# Patient Record
Sex: Male | Born: 1935 | Race: White | Hispanic: No | Marital: Married | Smoking: Never smoker
Health system: Southern US, Community
[De-identification: ages and names within clinical notes are randomized; demographics above are authoritative.]

## PROBLEM LIST (undated history)

## (undated) DIAGNOSIS — E785 Hyperlipidemia, unspecified: Secondary | ICD-10-CM

## (undated) DIAGNOSIS — I10 Essential (primary) hypertension: Secondary | ICD-10-CM

## (undated) DIAGNOSIS — E119 Type 2 diabetes mellitus without complications: Secondary | ICD-10-CM

---

## 2015-02-19 ENCOUNTER — Encounter: Payer: Self-pay | Admitting: Family Medicine

## 2015-02-19 ENCOUNTER — Ambulatory Visit (INDEPENDENT_AMBULATORY_CARE_PROVIDER_SITE_OTHER): Payer: Medicare Other | Admitting: Family Medicine

## 2015-02-19 VITALS — BP 113/57 | HR 75 | Wt 199.0 lb

## 2015-02-19 DIAGNOSIS — M7550 Bursitis of unspecified shoulder: Secondary | ICD-10-CM | POA: Insufficient documentation

## 2015-02-19 DIAGNOSIS — M7552 Bursitis of left shoulder: Secondary | ICD-10-CM | POA: Diagnosis not present

## 2015-02-19 DIAGNOSIS — M7541 Impingement syndrome of right shoulder: Secondary | ICD-10-CM | POA: Insufficient documentation

## 2015-02-19 NOTE — Assessment & Plan Note (Signed)
Patient most likely has subacromial bursitis or impingement or mild rotator cuff tendinopathy. He had immediate complete resolution of pain with diagnostic and hopefully therapeutic  guided subacromial injection. Plan for continued home physical therapy type exercises. Recheck in one month.

## 2015-02-19 NOTE — Progress Notes (Signed)
Henry Matthews is a 81 y.o. male who presents to [name]: Primary Care today for second opinion regarding left shoulder pain.  Patient has ongoing left shoulder pain now for several months. He's been evaluated by [facility] primary care sports medicine. X-ray was unremarkable. Unfortunately he is not a candidate for MRI because of shrapnel in his chest. He's had a trial of non-ultrasound-guided subacromial injection and ultrasound-guided glenohumeral injection. He will receive no benefit from either injection. Diagnostic ultrasound per [facility] does show some supraspinatus tendinopathy. Patient experiences pain with overhand motion and reaching back. The pain is located in the anterior lateral upper arm. He denies any significant radiating pain weakness or numbness. Additionally he has pain at night. He has tried some over-the-counter medicines and physical therapy which have helped only minimally.   No past medical history on file. No past surgical history on file. Social History  Substance Use Topics  . Smoking status: Never Smoker   . Smokeless tobacco: Not on file  . Alcohol Use: No   family history is not on file.  Review of Systems: No headache, visual changes, nausea, vomiting, diarrhea, constipation, dizziness, abdominal pain, skin rash, fevers, chills, night sweats, weight loss, swollen lymph nodes, body aches, joint swelling, muscle aches, chest pain, shortness of breath, mood changes, visual or auditory hallucinations.   Medications: Current Outpatient Prescriptions  Medication Sig Dispense Refill  . apixaban (ELIQUIS) 5 MG TABS tablet Take 5 mg by mouth.    Marland Kitchen atorvastatin (LIPITOR) 40 MG tablet Take 40 mg by mouth.    . diltiazem (CARDIZEM CD) 360 MG 24 hr capsule Take 360 mg by mouth.    Marland Kitchen lisinopril (PRINIVIL,ZESTRIL) 20 MG tablet Take 20 mg by mouth.    . metFORMIN  (GLUMETZA) 500 MG (MOD) 24 hr tablet Take 1,000 mg by mouth.     No current facility-administered medications for this visit.   No Known Allergies   Exam:  BP 113/57 mmHg  Pulse 75  Wt 199 lb (90.266 kg) Gen: Well NAD General: Well Developed, well nourished, and in no acute distress.  Neuro/Psych: Alert and oriented x3, extra-ocular muscles intact, able to move all 4 extremities, sensation grossly intact. Skin: Warm and dry, no rashes noted.  Respiratory: Not using accessory muscles, speaking in full sentences, trachea midline.  Cardiovascular: Pulses palpable, no extremity edema. Abdomen: Does not appear distended. Left shoulder relatively normal appearing. Nontender. Range of motion normal external rotation. Internal rotation to the lumbar spine. Abduction full however patient experiences pain with abduction arc beyond 100. Positive Hawkins and Neer's test. Mildly positive empty can test. Negative Yergason's and speeds test. Strength is intact at abduction external and internal rotation. Pulses capillary refill sensation are intact.  Procedure: Real-time Ultrasound Guided Injection of left subacromial bursa  Device: GE Logiq E  Images permanently stored and available for review in the ultrasound unit. Verbal informed consent obtained. Discussed risks and benefits of procedure. Warned about infection bleeding damage to structures skin hypopigmentation and fat atrophy among others. Patient expresses understanding and agreement Time-out conducted.  Noted no overlying erythema, induration, or other signs of local infection.  Skin prepped in a sterile fashion.  Local anesthesia: Topical Ethyl chloride.  With sterile technique and under real time ultrasound guidance: 40 mg of Kenalog and 3 mL of Marcaine injected easily.  Completed without difficulty  Pain immediately resolved suggesting accurate placement of the medication.  Advised to call if fevers/chills,  erythema,  induration, drainage, or persistent bleeding.  Images permanently stored and available for review in the ultrasound unit.  Impression: Technically successful ultrasound guided injection.  Please see individual assessment and plan sections of the noted today.

## 2015-02-19 NOTE — Patient Instructions (Signed)
Thank you for coming in today. Call or go to the ER if you develop a large red swollen joint with extreme pain or oozing puss.   Impingement Syndrome, Rotator Cuff, Bursitis With Rehab Impingement syndrome is a condition that involves inflammation of the tendons of the rotator cuff and the subacromial bursa, that causes pain in the shoulder. The rotator cuff consists of four tendons and muscles that control much of the shoulder and upper arm function. The subacromial bursa is a fluid filled sac that helps reduce friction between the rotator cuff and one of the bones of the shoulder (acromion). Impingement syndrome is usually an overuse injury that causes swelling of the bursa (bursitis), swelling of the tendon (tendonitis), and/or a tear of the tendon (strain). Strains are classified into three categories. Grade 1 strains cause pain, but the tendon is not lengthened. Grade 2 strains include a lengthened ligament, due to the ligament being stretched or partially ruptured. With grade 2 strains there is still function, although the function may be decreased. Grade 3 strains include a complete tear of the tendon or muscle, and function is usually impaired. SYMPTOMS   Pain around the shoulder, often at the outer portion of the upper arm.  Pain that gets worse with shoulder function, especially when reaching overhead or lifting.  Sometimes, aching when not using the arm.  Pain that wakes you up at night.  Sometimes, tenderness, swelling, warmth, or redness over the affected area.  Loss of strength.  Limited motion of the shoulder, especially reaching behind the back (to the back pocket or to unhook bra) or across your body.  Crackling sound (crepitation) when moving the arm.  Biceps tendon pain and inflammation (in the front of the shoulder). Worse when bending the elbow or lifting. CAUSES  Impingement syndrome is often an overuse injury, in which chronic (repetitive) motions cause the tendons or  bursa to become inflamed. A strain occurs when a force is paced on the tendon or muscle that is greater than it can withstand. Common mechanisms of injury include: Stress from sudden increase in duration, frequency, or intensity of training.  Direct hit (trauma) to the shoulder.  Aging, erosion of the tendon with normal use.  Bony bump on shoulder (acromial spur). RISK INCREASES WITH:  Contact sports (football, wrestling, boxing).  Throwing sports (baseball, tennis, volleyball).  Weightlifting and bodybuilding.  Heavy labor.  Previous injury to the rotator cuff, including impingement.  Poor shoulder strength and flexibility.  Failure to warm up properly before activity.  Inadequate protective equipment.  Old age.  Bony bump on shoulder (acromial spur). PREVENTION   Warm up and stretch properly before activity.  Allow for adequate recovery between workouts.  Maintain physical fitness:  Strength, flexibility, and endurance.  Cardiovascular fitness.  Learn and use proper exercise technique. PROGNOSIS  If treated properly, impingement syndrome usually goes away within 6 weeks. Sometimes surgery is required.  RELATED COMPLICATIONS   Longer healing time if not properly treated, or if not given enough time to heal.  Recurring symptoms, that result in a chronic condition.  Shoulder stiffness, frozen shoulder, or loss of motion.  Rotator cuff tendon tear.  Recurring symptoms, especially if activity is resumed too soon, with overuse, with a direct blow, or when using poor technique. TREATMENT  Treatment first involves the use of ice and medicine, to reduce pain and inflammation. The use of strengthening and stretching exercises may help reduce pain with activity. These exercises may be performed at home or   with a therapist. If non-surgical treatment is unsuccessful after more than 6 months, surgery may be advised. After surgery and rehabilitation, activity is usually  possible in 3 months.  MEDICATION  If pain medicine is needed, nonsteroidal anti-inflammatory medicines (aspirin and ibuprofen), or other minor pain relievers (acetaminophen), are often advised.  Do not take pain medicine for 7 days before surgery.  Prescription pain relievers may be given, if your caregiver thinks they are needed. Use only as directed and only as much as you need.  Corticosteroid injections may be given by your caregiver. These injections should be reserved for the most serious cases, because they may only be given a certain number of times. HEAT AND COLD  Cold treatment (icing) should be applied for 10 to 15 minutes every 2 to 3 hours for inflammation and pain, and immediately after activity that aggravates your symptoms. Use ice packs or an ice massage.  Heat treatment may be used before performing stretching and strengthening activities prescribed by your caregiver, physical therapist, or athletic trainer. Use a heat pack or a warm water soak. SEEK MEDICAL CARE IF:   Symptoms get worse or do not improve in 4 to 6 weeks, despite treatment.  New, unexplained symptoms develop. (Drugs used in treatment may produce side effects.) EXERCISES  RANGE OF MOTION (ROM) AND STRETCHING EXERCISES - Impingement Syndrome (Rotator Cuff  Tendinitis, Bursitis) These exercises may help you when beginning to rehabilitate your injury. Your symptoms may go away with or without further involvement from your physician, physical therapist or athletic trainer. While completing these exercises, remember:   Restoring tissue flexibility helps normal motion to return to the joints. This allows healthier, less painful movement and activity.  An effective stretch should be held for at least 30 seconds.  A stretch should never be painful. You should only feel a gentle lengthening or release in the stretched tissue. STRETCH - Flexion, Standing  Stand with good posture. With an underhand grip on your  right / left hand, and an overhand grip on the opposite hand, grasp a broomstick or cane so that your hands are a little more than shoulder width apart.  Keeping your right / left elbow straight and shoulder muscles relaxed, push the stick with your opposite hand, to raise your right / left arm in front of your body and then overhead. Raise your arm until you feel a stretch in your right / left shoulder, but before you have increased shoulder pain.  Try to avoid shrugging your right / left shoulder as your arm rises, by keeping your shoulder blade tucked down and toward your mid-back spine. Hold for __________ seconds.  Slowly return to the starting position. Repeat __________ times. Complete this exercise __________ times per day. STRETCH - Abduction, Supine  Lie on your back. With an underhand grip on your right / left hand and an overhand grip on the opposite hand, grasp a broomstick or cane so that your hands are a little more than shoulder width apart.  Keeping your right / left elbow straight and your shoulder muscles relaxed, push the stick with your opposite hand, to raise your right / left arm out to the side of your body and then overhead. Raise your arm until you feel a stretch in your right / left shoulder, but before you have increased shoulder pain.  Try to avoid shrugging your right / left shoulder as your arm rises, by keeping your shoulder blade tucked down and toward your mid-back spine. Hold   for __________ seconds.  Slowly return to the starting position. Repeat __________ times. Complete this exercise __________ times per day. ROM - Flexion, Active-Assisted  Lie on your back. You may bend your knees for comfort.  Grasp a broomstick or cane so your hands are about shoulder width apart. Your right / left hand should grip the end of the stick, so that your hand is positioned "thumbs-up," as if you were about to shake hands.  Using your healthy arm to lead, raise your right /  left arm overhead, until you feel a gentle stretch in your shoulder. Hold for __________ seconds.  Use the stick to assist in returning your right / left arm to its starting position. Repeat __________ times. Complete this exercise __________ times per day.  ROM - Internal Rotation, Supine   Lie on your back on a firm surface. Place your right / left elbow about 60 degrees away from your side. Elevate your elbow with a folded towel, so that the elbow and shoulder are the same height.  Using a broomstick or cane and your strong arm, pull your right / left hand toward your body until you feel a gentle stretch, but no increase in your shoulder pain. Keep your shoulder and elbow in place throughout the exercise.  Hold for __________ seconds. Slowly return to the starting position. Repeat __________ times. Complete this exercise __________ times per day. STRETCH - Internal Rotation  Place your right / left hand behind your back, palm up.  Throw a towel or belt over your opposite shoulder. Grasp the towel with your right / left hand.  While keeping an upright posture, gently pull up on the towel, until you feel a stretch in the front of your right / left shoulder.  Avoid shrugging your right / left shoulder as your arm rises, by keeping your shoulder blade tucked down and toward your mid-back spine.  Hold for __________ seconds. Release the stretch, by lowering your healthy hand. Repeat __________ times. Complete this exercise __________ times per day. ROM - Internal Rotation   Using an underhand grip, grasp a stick behind your back with both hands.  While standing upright with good posture, slide the stick up your back until you feel a mild stretch in the front of your shoulder.  Hold for __________ seconds. Slowly return to your starting position. Repeat __________ times. Complete this exercise __________ times per day.  STRETCH - Posterior Shoulder Capsule   Stand or sit with good  posture. Grasp your right / left elbow and draw it across your chest, keeping it at the same height as your shoulder.  Pull your elbow, so your upper arm comes in closer to your chest. Pull until you feel a gentle stretch in the back of your shoulder.  Hold for __________ seconds. Repeat __________ times. Complete this exercise __________ times per day. STRENGTHENING EXERCISES - Impingement Syndrome (Rotator Cuff Tendinitis, Bursitis) These exercises may help you when beginning to rehabilitate your injury. They may resolve your symptoms with or without further involvement from your physician, physical therapist or athletic trainer. While completing these exercises, remember:  Muscles can gain both the endurance and the strength needed for everyday activities through controlled exercises.  Complete these exercises as instructed by your physician, physical therapist or athletic trainer. Increase the resistance and repetitions only as guided.  You may experience muscle soreness or fatigue, but the pain or discomfort you are trying to eliminate should never worsen during these exercises. If this   pain does get worse, stop and make sure you are following the directions exactly. If the pain is still present after adjustments, discontinue the exercise until you can discuss the trouble with your clinician.  During your recovery, avoid activity or exercises which involve actions that place your injured hand or elbow above your head or behind your back or head. These positions stress the tissues which you are trying to heal. STRENGTH - Scapular Depression and Adduction   With good posture, sit on a firm chair. Support your arms in front of you, with pillows, arm rests, or on a table top. Have your elbows in line with the sides of your body.  Gently draw your shoulder blades down and toward your mid-back spine. Gradually increase the tension, without tensing the muscles along the top of your shoulders and  the back of your neck.  Hold for __________ seconds. Slowly release the tension and relax your muscles completely before starting the next repetition.  After you have practiced this exercise, remove the arm support and complete the exercise in standing as well as sitting position. Repeat __________ times. Complete this exercise __________ times per day.  STRENGTH - Shoulder Abductors, Isometric  With good posture, stand or sit about 4-6 inches from a wall, with your right / left side facing the wall.  Bend your right / left elbow. Gently press your right / left elbow into the wall. Increase the pressure gradually, until you are pressing as hard as you can, without shrugging your shoulder or increasing any shoulder discomfort.  Hold for __________ seconds.  Release the tension slowly. Relax your shoulder muscles completely before you begin the next repetition. Repeat __________ times. Complete this exercise __________ times per day.  STRENGTH - External Rotators, Isometric  Keep your right / left elbow at your side and bend it 90 degrees.  Step into a door frame so that the outside of your right / left wrist can press against the door frame without your upper arm leaving your side.  Gently press your right / left wrist into the door frame, as if you were trying to swing the back of your hand away from your stomach. Gradually increase the tension, until you are pressing as hard as you can, without shrugging your shoulder or increasing any shoulder discomfort.  Hold for __________ seconds.  Release the tension slowly. Relax your shoulder muscles completely before you begin the next repetition. Repeat __________ times. Complete this exercise __________ times per day.  STRENGTH - Supraspinatus   Stand or sit with good posture. Grasp a __________ weight, or an exercise band or tubing, so that your hand is "thumbs-up," like you are shaking hands.  Slowly lift your right / left arm in a "V"  away from your thigh, diagonally into the space between your side and straight ahead. Lift your hand to shoulder height or as far as you can, without increasing any shoulder pain. At first, many people do not lift their hands above shoulder height.  Avoid shrugging your right / left shoulder as your arm rises, by keeping your shoulder blade tucked down and toward your mid-back spine.  Hold for __________ seconds. Control the descent of your hand, as you slowly return to your starting position. Repeat __________ times. Complete this exercise __________ times per day.  STRENGTH - External Rotators  Secure a rubber exercise band or tubing to a fixed object (table, pole) so that it is at the same height as your right /   left elbow when you are standing or sitting on a firm surface.  Stand or sit so that the secured exercise band is at your uninjured side.  Bend your right / left elbow 90 degrees. Place a folded towel or small pillow under your right / left arm, so that your elbow is a few inches away from your side.  Keeping the tension on the exercise band, pull it away from your body, as if pivoting on your elbow. Be sure to keep your body steady, so that the movement is coming only from your rotating shoulder.  Hold for __________ seconds. Release the tension in a controlled manner, as you return to the starting position. Repeat __________ times. Complete this exercise __________ times per day.  STRENGTH - Internal Rotators   Secure a rubber exercise band or tubing to a fixed object (table, pole) so that it is at the same height as your right / left elbow when you are standing or sitting on a firm surface.  Stand or sit so that the secured exercise band is at your right / left side.  Bend your elbow 90 degrees. Place a folded towel or small pillow under your right / left arm so that your elbow is a few inches away from your side.  Keeping the tension on the exercise band, pull it across your  body, toward your stomach. Be sure to keep your body steady, so that the movement is coming only from your rotating shoulder.  Hold for __________ seconds. Release the tension in a controlled manner, as you return to the starting position. Repeat __________ times. Complete this exercise __________ times per day.  STRENGTH - Scapular Protractors, Standing   Stand arms length away from a wall. Place your hands on the wall, keeping your elbows straight.  Begin by dropping your shoulder blades down and toward your mid-back spine.  To strengthen your protractors, keep your shoulder blades down, but slide them forward on your rib cage. It will feel as if you are lifting the back of your rib cage away from the wall. This is a subtle motion and can be challenging to complete. Ask your caregiver for further instruction, if you are not sure you are doing the exercise correctly.  Hold for __________ seconds. Slowly return to the starting position, resting the muscles completely before starting the next repetition. Repeat __________ times. Complete this exercise __________ times per day. STRENGTH - Scapular Protractors, Supine  Lie on your back on a firm surface. Extend your right / left arm straight into the air while holding a __________ weight in your hand.  Keeping your head and back in place, lift your shoulder off the floor.  Hold for __________ seconds. Slowly return to the starting position, and allow your muscles to relax completely before starting the next repetition. Repeat __________ times. Complete this exercise __________ times per day. STRENGTH - Scapular Protractors, Quadruped  Get onto your hands and knees, with your shoulders directly over your hands (or as close as you can be, comfortably).  Keeping your elbows locked, lift the back of your rib cage up into your shoulder blades, so your mid-back rounds out. Keep your neck muscles relaxed.  Hold this position for __________ seconds.  Slowly return to the starting position and allow your muscles to relax completely before starting the next repetition. Repeat __________ times. Complete this exercise __________ times per day.  STRENGTH - Scapular Retractors  Secure a rubber exercise band or tubing to a   fixed object (table, pole), so that it is at the height of your shoulders when you are either standing, or sitting on a firm armless chair.  With a palm down grip, grasp an end of the band in each hand. Straighten your elbows and lift your hands straight in front of you, at shoulder height. Step back, away from the secured end of the band, until it becomes tense.  Squeezing your shoulder blades together, draw your elbows back toward your sides, as you bend them. Keep your upper arms lifted away from your body throughout the exercise.  Hold for __________ seconds. Slowly ease the tension on the band, as you reverse the directions and return to the starting position. Repeat __________ times. Complete this exercise __________ times per day. STRENGTH - Shoulder Extensors   Secure a rubber exercise band or tubing to a fixed object (table, pole) so that it is at the height of your shoulders when you are either standing, or sitting on a firm armless chair.  With a thumbs-up grip, grasp an end of the band in each hand. Straighten your elbows and lift your hands straight in front of you, at shoulder height. Step back, away from the secured end of the band, until it becomes tense.  Squeezing your shoulder blades together, pull your hands down to the sides of your thighs. Do not allow your hands to go behind you.  Hold for __________ seconds. Slowly ease the tension on the band, as you reverse the directions and return to the starting position. Repeat __________ times. Complete this exercise __________ times per day.  STRENGTH - Scapular Retractors and External Rotators   Secure a rubber exercise band or tubing to a fixed object (table,  pole) so that it is at the height as your shoulders, when you are either standing, or sitting on a firm armless chair.  With a palm down grip, grasp an end of the band in each hand. Bend your elbows 90 degrees and lift your elbows to shoulder height, at your sides. Step back, away from the secured end of the band, until it becomes tense.  Squeezing your shoulder blades together, rotate your shoulders so that your upper arms and elbows remain stationary, but your fists travel upward to head height.  Hold for __________ seconds. Slowly ease the tension on the band, as you reverse the directions and return to the starting position. Repeat __________ times. Complete this exercise __________ times per day.  STRENGTH - Scapular Retractors and External Rotators, Rowing   Secure a rubber exercise band or tubing to a fixed object (table, pole) so that it is at the height of your shoulders, when you are either standing, or sitting on a firm armless chair.  With a palm down grip, grasp an end of the band in each hand. Straighten your elbows and lift your hands straight in front of you, at shoulder height. Step back, away from the secured end of the band, until it becomes tense.  Step 1: Squeeze your shoulder blades together. Bending your elbows, draw your hands to your chest, as if you are rowing a boat. At the end of this motion, your hands and elbow should be at shoulder height and your elbows should be out to your sides.  Step 2: Rotate your shoulders, to raise your hands above your head. Your forearms should be vertical and your upper arms should be horizontal.  Hold for __________ seconds. Slowly ease the tension on the band, as you  reverse the directions and return to the starting position. Repeat __________ times. Complete this exercise __________ times per day.  STRENGTH - Scapular Depressors  Find a sturdy chair without wheels, such as a dining room chair.  Keeping your feet on the floor, and  your hands on the chair arms, lift your bottom up from the seat, and lock your elbows.  Keeping your elbows straight, allow gravity to pull your body weight down. Your shoulders will rise toward your ears.  Raise your body against gravity by drawing your shoulder blades down your back, shortening the distance between your shoulders and ears. Although your feet should always maintain contact with the floor, your feet should progressively support less body weight, as you get stronger.  Hold for __________ seconds. In a controlled and slow manner, lower your body weight to begin the next repetition. Repeat __________ times. Complete this exercise __________ times per day.    This information is not intended to replace advice given to you by your health care provider. Make sure you discuss any questions you have with your health care provider.   Document Released: 01/18/2005 Document Revised: 02/08/2014 Document Reviewed: 05/02/2008 [employer] Interactive Patient Education [employer]

## 2015-02-19 NOTE — Progress Notes (Signed)
Not faxed to  [facility], DO [phone]

## 2015-03-19 ENCOUNTER — Encounter: Payer: Self-pay | Admitting: Family Medicine

## 2015-03-19 ENCOUNTER — Ambulatory Visit (INDEPENDENT_AMBULATORY_CARE_PROVIDER_SITE_OTHER): Payer: Medicare Other | Admitting: Family Medicine

## 2015-03-19 VITALS — BP 122/64 | HR 78 | Wt 197.0 lb

## 2015-03-19 DIAGNOSIS — M7552 Bursitis of left shoulder: Secondary | ICD-10-CM

## 2015-03-19 NOTE — Assessment & Plan Note (Signed)
Much improved following injection. Follow-up with PCP. Return as needed. Continue home exercises.

## 2015-03-19 NOTE — Progress Notes (Signed)
       Henry Matthews is a 81 y.o. male who presents to [name]: Primary Care today for follow-up left shoulder pain. Patient was seen last month for shoulder pain thought to be related to subacromial bursitis. He had a diagnostic and therapeutic subacromial injection and feels almost 100% better now. He continues his home exercise and is pain-free. He is thrilled with his results.   No past medical history on file. No past surgical history on file. Social History  Substance Use Topics  . Smoking status: Never Smoker   . Smokeless tobacco: Not on file  . Alcohol Use: No   family history is not on file.  ROS as above Medications: Current Outpatient Prescriptions  Medication Sig Dispense Refill  . apixaban (ELIQUIS) 5 MG TABS tablet Take 5 mg by mouth.    Marland Kitchen atorvastatin (LIPITOR) 40 MG tablet Take 40 mg by mouth.    . diltiazem (CARDIZEM CD) 360 MG 24 hr capsule Take 360 mg by mouth.    Marland Kitchen lisinopril (PRINIVIL,ZESTRIL) 20 MG tablet Take 20 mg by mouth.    . metFORMIN (GLUMETZA) 500 MG (MOD) 24 hr tablet Take 1,000 mg by mouth.     No current facility-administered medications for this visit.   No Known Allergies   Exam:  BP 122/64 mmHg  Pulse 78  Wt 197 lb (89.359 kg) Gen: Well NAD Left shoulder. Normal-appearing. Normal motion.   No results found for this or any previous visit (from the past 24 hour(s)). No results found.   Please see individual assessment and plan sections.

## 2015-09-30 ENCOUNTER — Ambulatory Visit (INDEPENDENT_AMBULATORY_CARE_PROVIDER_SITE_OTHER): Payer: Medicare Other | Admitting: Family Medicine

## 2015-09-30 VITALS — BP 149/76 | HR 106 | Temp 97.9°F | Resp 18 | Wt 194.1 lb

## 2015-09-30 DIAGNOSIS — M7552 Bursitis of left shoulder: Secondary | ICD-10-CM | POA: Diagnosis not present

## 2015-09-30 NOTE — Progress Notes (Signed)
Pt here for recurrent left shoulder pain.

## 2015-09-30 NOTE — Patient Instructions (Signed)
Thank you for coming in today. Call or go to the ER if you develop a large red swollen joint with extreme pain or oozing puss.  Return as needed.   Continue the home exercises.    Impingement Syndrome, Rotator Cuff, Bursitis With Rehab Impingement syndrome is a condition that involves inflammation of the tendons of the rotator cuff and the subacromial bursa, that causes pain in the shoulder. The rotator cuff consists of four tendons and muscles that control much of the shoulder and upper arm function. The subacromial bursa is a fluid filled sac that helps reduce friction between the rotator cuff and one of the bones of the shoulder (acromion). Impingement syndrome is usually an overuse injury that causes swelling of the bursa (bursitis), swelling of the tendon (tendonitis), and/or a tear of the tendon (strain). Strains are classified into three categories. Grade 1 strains cause pain, but the tendon is not lengthened. Grade 2 strains include a lengthened ligament, due to the ligament being stretched or partially ruptured. With grade 2 strains there is still function, although the function may be decreased. Grade 3 strains include a complete tear of the tendon or muscle, and function is usually impaired. SYMPTOMS   Pain around the shoulder, often at the outer portion of the upper arm.  Pain that gets worse with shoulder function, especially when reaching overhead or lifting.  Sometimes, aching when not using the arm.  Pain that wakes you up at night.  Sometimes, tenderness, swelling, warmth, or redness over the affected area.  Loss of strength.  Limited motion of the shoulder, especially reaching behind the back (to the back pocket or to unhook bra) or across your body.  Crackling sound (crepitation) when moving the arm.  Biceps tendon pain and inflammation (in the front of the shoulder). Worse when bending the elbow or lifting. CAUSES  Impingement syndrome is often an overuse injury, in  which chronic (repetitive) motions cause the tendons or bursa to become inflamed. A strain occurs when a force is paced on the tendon or muscle that is greater than it can withstand. Common mechanisms of injury include: Stress from sudden increase in duration, frequency, or intensity of training.  Direct hit (trauma) to the shoulder.  Aging, erosion of the tendon with normal use.  Bony bump on shoulder (acromial spur). RISK INCREASES WITH:  Contact sports (football, wrestling, boxing).  Throwing sports (baseball, tennis, volleyball).  Weightlifting and bodybuilding.  Heavy labor.  Previous injury to the rotator cuff, including impingement.  Poor shoulder strength and flexibility.  Failure to warm up properly before activity.  Inadequate protective equipment.  Old age.  Bony bump on shoulder (acromial spur). PREVENTION   Warm up and stretch properly before activity.  Allow for adequate recovery between workouts.  Maintain physical fitness:  Strength, flexibility, and endurance.  Cardiovascular fitness.  Learn and use proper exercise technique. PROGNOSIS  If treated properly, impingement syndrome usually goes away within 6 weeks. Sometimes surgery is required.  RELATED COMPLICATIONS   Longer healing time if not properly treated, or if not given enough time to heal.  Recurring symptoms, that result in a chronic condition.  Shoulder stiffness, frozen shoulder, or loss of motion.  Rotator cuff tendon tear.  Recurring symptoms, especially if activity is resumed too soon, with overuse, with a direct blow, or when using poor technique. TREATMENT  Treatment first involves the use of ice and medicine, to reduce pain and inflammation. The use of strengthening and stretching exercises may help reduce  pain with activity. These exercises may be performed at home or with a therapist. If non-surgical treatment is unsuccessful after more than 6 months, surgery may be advised.  After surgery and rehabilitation, activity is usually possible in 3 months.  MEDICATION  If pain medicine is needed, nonsteroidal anti-inflammatory medicines (aspirin and ibuprofen), or other minor pain relievers (acetaminophen), are often advised.  Do not take pain medicine for 7 days before surgery.  Prescription pain relievers may be given, if your caregiver thinks they are needed. Use only as directed and only as much as you need.  Corticosteroid injections may be given by your caregiver. These injections should be reserved for the most serious cases, because they may only be given a certain number of times. HEAT AND COLD  Cold treatment (icing) should be applied for 10 to 15 minutes every 2 to 3 hours for inflammation and pain, and immediately after activity that aggravates your symptoms. Use ice packs or an ice massage.  Heat treatment may be used before performing stretching and strengthening activities prescribed by your caregiver, physical therapist, or athletic trainer. Use a heat pack or a warm water soak. SEEK MEDICAL CARE IF:   Symptoms get worse or do not improve in 4 to 6 weeks, despite treatment.  New, unexplained symptoms develop. (Drugs used in treatment may produce side effects.) EXERCISES  RANGE OF MOTION (ROM) AND STRETCHING EXERCISES - Impingement Syndrome (Rotator Cuff  Tendinitis, Bursitis) These exercises may help you when beginning to rehabilitate your injury. Your symptoms may go away with or without further involvement from your physician, physical therapist or athletic trainer. While completing these exercises, remember:   Restoring tissue flexibility helps normal motion to return to the joints. This allows healthier, less painful movement and activity.  An effective stretch should be held for at least 30 seconds.  A stretch should never be painful. You should only feel a gentle lengthening or release in the stretched tissue. STRETCH - Flexion,  Standing  Stand with good posture. With an underhand grip on your right / left hand, and an overhand grip on the opposite hand, grasp a broomstick or cane so that your hands are a little more than shoulder width apart.  Keeping your right / left elbow straight and shoulder muscles relaxed, push the stick with your opposite hand, to raise your right / left arm in front of your body and then overhead. Raise your arm until you feel a stretch in your right / left shoulder, but before you have increased shoulder pain.  Try to avoid shrugging your right / left shoulder as your arm rises, by keeping your shoulder blade tucked down and toward your mid-back spine. Hold for __________ seconds.  Slowly return to the starting position. Repeat __________ times. Complete this exercise __________ times per day. STRETCH - Abduction, Supine  Lie on your back. With an underhand grip on your right / left hand and an overhand grip on the opposite hand, grasp a broomstick or cane so that your hands are a little more than shoulder width apart.  Keeping your right / left elbow straight and your shoulder muscles relaxed, push the stick with your opposite hand, to raise your right / left arm out to the side of your body and then overhead. Raise your arm until you feel a stretch in your right / left shoulder, but before you have increased shoulder pain.  Try to avoid shrugging your right / left shoulder as your arm rises, by keeping  your shoulder blade tucked down and toward your mid-back spine. Hold for __________ seconds.  Slowly return to the starting position. Repeat __________ times. Complete this exercise __________ times per day. ROM - Flexion, Active-Assisted  Lie on your back. You may bend your knees for comfort.  Grasp a broomstick or cane so your hands are about shoulder width apart. Your right / left hand should grip the end of the stick, so that your hand is positioned "thumbs-up," as if you were about to  shake hands.  Using your healthy arm to lead, raise your right / left arm overhead, until you feel a gentle stretch in your shoulder. Hold for __________ seconds.  Use the stick to assist in returning your right / left arm to its starting position. Repeat __________ times. Complete this exercise __________ times per day.  ROM - Internal Rotation, Supine   Lie on your back on a firm surface. Place your right / left elbow about 60 degrees away from your side. Elevate your elbow with a folded towel, so that the elbow and shoulder are the same height.  Using a broomstick or cane and your strong arm, pull your right / left hand toward your body until you feel a gentle stretch, but no increase in your shoulder pain. Keep your shoulder and elbow in place throughout the exercise.  Hold for __________ seconds. Slowly return to the starting position. Repeat __________ times. Complete this exercise __________ times per day. STRETCH - Internal Rotation  Place your right / left hand behind your back, palm up.  Throw a towel or belt over your opposite shoulder. Grasp the towel with your right / left hand.  While keeping an upright posture, gently pull up on the towel, until you feel a stretch in the front of your right / left shoulder.  Avoid shrugging your right / left shoulder as your arm rises, by keeping your shoulder blade tucked down and toward your mid-back spine.  Hold for __________ seconds. Release the stretch, by lowering your healthy hand. Repeat __________ times. Complete this exercise __________ times per day. ROM - Internal Rotation   Using an underhand grip, grasp a stick behind your back with both hands.  While standing upright with good posture, slide the stick up your back until you feel a mild stretch in the front of your shoulder.  Hold for __________ seconds. Slowly return to your starting position. Repeat __________ times. Complete this exercise __________ times per day.   STRETCH - Posterior Shoulder Capsule   Stand or sit with good posture. Grasp your right / left elbow and draw it across your chest, keeping it at the same height as your shoulder.  Pull your elbow, so your upper arm comes in closer to your chest. Pull until you feel a gentle stretch in the back of your shoulder.  Hold for __________ seconds. Repeat __________ times. Complete this exercise __________ times per day. STRENGTHENING EXERCISES - Impingement Syndrome (Rotator Cuff Tendinitis, Bursitis) These exercises may help you when beginning to rehabilitate your injury. They may resolve your symptoms with or without further involvement from your physician, physical therapist or athletic trainer. While completing these exercises, remember:  Muscles can gain both the endurance and the strength needed for everyday activities through controlled exercises.  Complete these exercises as instructed by your physician, physical therapist or athletic trainer. Increase the resistance and repetitions only as guided.  You may experience muscle soreness or fatigue, but the pain or discomfort you are  trying to eliminate should never worsen during these exercises. If this pain does get worse, stop and make sure you are following the directions exactly. If the pain is still present after adjustments, discontinue the exercise until you can discuss the trouble with your clinician.  During your recovery, avoid activity or exercises which involve actions that place your injured hand or elbow above your head or behind your back or head. These positions stress the tissues which you are trying to heal. STRENGTH - Scapular Depression and Adduction   With good posture, sit on a firm chair. Support your arms in front of you, with pillows, arm rests, or on a table top. Have your elbows in line with the sides of your body.  Gently draw your shoulder blades down and toward your mid-back spine. Gradually increase the tension,  without tensing the muscles along the top of your shoulders and the back of your neck.  Hold for __________ seconds. Slowly release the tension and relax your muscles completely before starting the next repetition.  After you have practiced this exercise, remove the arm support and complete the exercise in standing as well as sitting position. Repeat __________ times. Complete this exercise __________ times per day.  STRENGTH - Shoulder Abductors, Isometric  With good posture, stand or sit about 4-6 inches from a wall, with your right / left side facing the wall.  Bend your right / left elbow. Gently press your right / left elbow into the wall. Increase the pressure gradually, until you are pressing as hard as you can, without shrugging your shoulder or increasing any shoulder discomfort.  Hold for __________ seconds.  Release the tension slowly. Relax your shoulder muscles completely before you begin the next repetition. Repeat __________ times. Complete this exercise __________ times per day.  STRENGTH - External Rotators, Isometric  Keep your right / left elbow at your side and bend it 90 degrees.  Step into a door frame so that the outside of your right / left wrist can press against the door frame without your upper arm leaving your side.  Gently press your right / left wrist into the door frame, as if you were trying to swing the back of your hand away from your stomach. Gradually increase the tension, until you are pressing as hard as you can, without shrugging your shoulder or increasing any shoulder discomfort.  Hold for __________ seconds.  Release the tension slowly. Relax your shoulder muscles completely before you begin the next repetition. Repeat __________ times. Complete this exercise __________ times per day.  STRENGTH - Supraspinatus   Stand or sit with good posture. Grasp a __________ weight, or an exercise band or tubing, so that your hand is "thumbs-up," like you  are shaking hands.  Slowly lift your right / left arm in a "V" away from your thigh, diagonally into the space between your side and straight ahead. Lift your hand to shoulder height or as far as you can, without increasing any shoulder pain. At first, many people do not lift their hands above shoulder height.  Avoid shrugging your right / left shoulder as your arm rises, by keeping your shoulder blade tucked down and toward your mid-back spine.  Hold for __________ seconds. Control the descent of your hand, as you slowly return to your starting position. Repeat __________ times. Complete this exercise __________ times per day.  STRENGTH - External Rotators  Secure a rubber exercise band or tubing to a fixed object (table, pole) so  that it is at the same height as your right / left elbow when you are standing or sitting on a firm surface.  Stand or sit so that the secured exercise band is at your uninjured side.  Bend your right / left elbow 90 degrees. Place a folded towel or small pillow under your right / left arm, so that your elbow is a few inches away from your side.  Keeping the tension on the exercise band, pull it away from your body, as if pivoting on your elbow. Be sure to keep your body steady, so that the movement is coming only from your rotating shoulder.  Hold for __________ seconds. Release the tension in a controlled manner, as you return to the starting position. Repeat __________ times. Complete this exercise __________ times per day.  STRENGTH - Internal Rotators   Secure a rubber exercise band or tubing to a fixed object (table, pole) so that it is at the same height as your right / left elbow when you are standing or sitting on a firm surface.  Stand or sit so that the secured exercise band is at your right / left side.  Bend your elbow 90 degrees. Place a folded towel or small pillow under your right / left arm so that your elbow is a few inches away from your  side.  Keeping the tension on the exercise band, pull it across your body, toward your stomach. Be sure to keep your body steady, so that the movement is coming only from your rotating shoulder.  Hold for __________ seconds. Release the tension in a controlled manner, as you return to the starting position. Repeat __________ times. Complete this exercise __________ times per day.  STRENGTH - Scapular Protractors, Standing   Stand arms length away from a wall. Place your hands on the wall, keeping your elbows straight.  Begin by dropping your shoulder blades down and toward your mid-back spine.  To strengthen your protractors, keep your shoulder blades down, but slide them forward on your rib cage. It will feel as if you are lifting the back of your rib cage away from the wall. This is a subtle motion and can be challenging to complete. Ask your caregiver for further instruction, if you are not sure you are doing the exercise correctly.  Hold for __________ seconds. Slowly return to the starting position, resting the muscles completely before starting the next repetition. Repeat __________ times. Complete this exercise __________ times per day. STRENGTH - Scapular Protractors, Supine  Lie on your back on a firm surface. Extend your right / left arm straight into the air while holding a __________ weight in your hand.  Keeping your head and back in place, lift your shoulder off the floor.  Hold for __________ seconds. Slowly return to the starting position, and allow your muscles to relax completely before starting the next repetition. Repeat __________ times. Complete this exercise __________ times per day. STRENGTH - Scapular Protractors, Quadruped  Get onto your hands and knees, with your shoulders directly over your hands (or as close as you can be, comfortably).  Keeping your elbows locked, lift the back of your rib cage up into your shoulder blades, so your mid-back rounds out. Keep  your neck muscles relaxed.  Hold this position for __________ seconds. Slowly return to the starting position and allow your muscles to relax completely before starting the next repetition. Repeat __________ times. Complete this exercise __________ times per day.  STRENGTH - Scapular  Retractors  Secure a rubber exercise band or tubing to a fixed object (table, pole), so that it is at the height of your shoulders when you are either standing, or sitting on a firm armless chair.  With a palm down grip, grasp an end of the band in each hand. Straighten your elbows and lift your hands straight in front of you, at shoulder height. Step back, away from the secured end of the band, until it becomes tense.  Squeezing your shoulder blades together, draw your elbows back toward your sides, as you bend them. Keep your upper arms lifted away from your body throughout the exercise.  Hold for __________ seconds. Slowly ease the tension on the band, as you reverse the directions and return to the starting position. Repeat __________ times. Complete this exercise __________ times per day. STRENGTH - Shoulder Extensors   Secure a rubber exercise band or tubing to a fixed object (table, pole) so that it is at the height of your shoulders when you are either standing, or sitting on a firm armless chair.  With a thumbs-up grip, grasp an end of the band in each hand. Straighten your elbows and lift your hands straight in front of you, at shoulder height. Step back, away from the secured end of the band, until it becomes tense.  Squeezing your shoulder blades together, pull your hands down to the sides of your thighs. Do not allow your hands to go behind you.  Hold for __________ seconds. Slowly ease the tension on the band, as you reverse the directions and return to the starting position. Repeat __________ times. Complete this exercise __________ times per day.  STRENGTH - Scapular Retractors and External  Rotators   Secure a rubber exercise band or tubing to a fixed object (table, pole) so that it is at the height as your shoulders, when you are either standing, or sitting on a firm armless chair.  With a palm down grip, grasp an end of the band in each hand. Bend your elbows 90 degrees and lift your elbows to shoulder height, at your sides. Step back, away from the secured end of the band, until it becomes tense.  Squeezing your shoulder blades together, rotate your shoulders so that your upper arms and elbows remain stationary, but your fists travel upward to head height.  Hold for __________ seconds. Slowly ease the tension on the band, as you reverse the directions and return to the starting position. Repeat __________ times. Complete this exercise __________ times per day.  STRENGTH - Scapular Retractors and External Rotators, Rowing   Secure a rubber exercise band or tubing to a fixed object (table, pole) so that it is at the height of your shoulders, when you are either standing, or sitting on a firm armless chair.  With a palm down grip, grasp an end of the band in each hand. Straighten your elbows and lift your hands straight in front of you, at shoulder height. Step back, away from the secured end of the band, until it becomes tense.  Step 1: Squeeze your shoulder blades together. Bending your elbows, draw your hands to your chest, as if you are rowing a boat. At the end of this motion, your hands and elbow should be at shoulder height and your elbows should be out to your sides.  Step 2: Rotate your shoulders, to raise your hands above your head. Your forearms should be vertical and your upper arms should be horizontal.  Hold for  __________ seconds. Slowly ease the tension on the band, as you reverse the directions and return to the starting position. Repeat __________ times. Complete this exercise __________ times per day.  STRENGTH - Scapular Depressors  Find a sturdy chair  without wheels, such as a dining room chair.  Keeping your feet on the floor, and your hands on the chair arms, lift your bottom up from the seat, and lock your elbows.  Keeping your elbows straight, allow gravity to pull your body weight down. Your shoulders will rise toward your ears.  Raise your body against gravity by drawing your shoulder blades down your back, shortening the distance between your shoulders and ears. Although your feet should always maintain contact with the floor, your feet should progressively support less body weight, as you get stronger.  Hold for __________ seconds. In a controlled and slow manner, lower your body weight to begin the next repetition. Repeat __________ times. Complete this exercise __________ times per day.    This information is not intended to replace advice given to you by your health care provider. Make sure you discuss any questions you have with your health care provider.   Document Released: 01/18/2005 Document Revised: 02/08/2014 Document Reviewed: 05/02/2008 [employer] Interactive Patient Education [employer]

## 2015-09-30 NOTE — Progress Notes (Addendum)
Henry Matthews is a 81 y.o. male who presents to [name]: Primary [facility] Sports Medicine today for left shoulder pain. Patient in the past has been diagnosed with and treated for subacromial bursitis/rotator cuff tendinosis several times. Most recently he received a subacromial injection on 02/19/2015 that worked until 2 days ago. He has been doing home exercises as well as home. 2 days ago he notes onset of pain in the anterior to lateral upper arm worsens overhead motion reaching back. He denies any radiating pain weakness or numbness or loss of function. Symptoms are moderate and do interfere with normal activities.   No past medical history on file. No past surgical history on file. Social History  Substance Use Topics  . Smoking status: Never Smoker  . Smokeless tobacco: Not on file  . Alcohol use No   family history is not on file.  ROS as above:  Medications: Current Outpatient Prescriptions  Medication Sig Dispense Refill  . apixaban (ELIQUIS) 5 MG TABS tablet Take 5 mg by mouth.    Marland Kitchen atorvastatin (LIPITOR) 40 MG tablet Take 40 mg by mouth.    . diltiazem (CARDIZEM CD) 360 MG 24 hr capsule Take 360 mg by mouth.    Marland Kitchen lisinopril (PRINIVIL,ZESTRIL) 20 MG tablet Take 20 mg by mouth.    . metFORMIN (GLUMETZA) 500 MG (MOD) 24 hr tablet Take 1,000 mg by mouth.     No current facility-administered medications for this visit.    No Known Allergies   Exam:  BP (!) 149/76 (BP Location: Right Arm, Patient Position: Sitting, Cuff Size: Normal)   Pulse (!) 106   Temp 97.9 F (36.6 C) (Oral)   Resp 18   Wt 194 lb 1.3 oz (88 kg)   SpO2 99%  Gen: Well NAD Left shoulder: Normal-appearing nontender. Normal motion of her pain with abduction arc. Mild positive impingement testing.  Procedure: Real-time Ultrasound Guided Injection of left subacromial bursa  Device: GE Logiq E  Images  permanently stored and available for review in the ultrasound unit. Verbal informed consent obtained. Discussed risks and benefits of procedure. Warned about infection bleeding damage to structures skin hypopigmentation and fat atrophy among others. Patient expresses understanding and agreement Time-out conducted.  Noted no overlying erythema, induration, or other signs of local infection.  Skin prepped in a sterile fashion.  Local anesthesia: Topical Ethyl chloride.  With sterile technique and under real time ultrasound guidance: 40 mg of Kenalog and 3 mL of Marcaine injected easily.  Completed without difficulty  Pain immediately resolved suggesting accurate placement of the medication.  Advised to call if fevers/chills, erythema, induration, drainage, or persistent bleeding.  Images permanently stored and available for review in the ultrasound unit.  Impression: Technically successful ultrasound guided injection.  Lot number: Marcaine [id number] Kenalog [id number]  No results found for this or any previous visit (from the past 24 hour(s)). No results found.    Assessment and Plan: 81 y.o. male with left shoulder impingement/bursitis. Patient had complete resolution of pain following injection and a history of great response to previous ultrasound-guided subacromial injections. We'll continue intermittent injections as needed along with home exercise program. If symptoms recur soon would recommend trial of formal physical therapy versus MRI and possible surgical intervention. Return as needed.  Patient notes his primary care provider at [facility] health care has been updating his routine health care.   No orders of the defined types were placed in this  encounter.   Discussed warning signs or symptoms. Please see discharge instructions. Patient expresses understanding.

## 2015-10-01 ENCOUNTER — Ambulatory Visit: Payer: Medicare Other | Admitting: Family Medicine

## 2015-10-28 ENCOUNTER — Encounter: Payer: Self-pay | Admitting: Family Medicine

## 2015-10-28 ENCOUNTER — Ambulatory Visit (INDEPENDENT_AMBULATORY_CARE_PROVIDER_SITE_OTHER): Payer: Medicare Other | Admitting: Family Medicine

## 2015-10-28 VITALS — BP 139/66 | HR 80 | Wt 196.0 lb

## 2015-10-28 DIAGNOSIS — Z23 Encounter for immunization: Secondary | ICD-10-CM | POA: Diagnosis not present

## 2015-10-28 DIAGNOSIS — M25512 Pain in left shoulder: Secondary | ICD-10-CM

## 2015-10-28 NOTE — Progress Notes (Signed)
       Henry Matthews is a 81 y.o. male who [facility] today for left shoulder pain. Patient was seen about a month ago for left shoulder pain thought to be due to subacromial bursitis. He had ultrasound-guided subacromial injection and had great resolution of pain until a few days ago and pain returned. He applied horse liniment which seemed to help a great deal. Currently he is essentially pain free and able to move his arm normally. He continues his home exercise program.   No past medical history on file. No past surgical history on file. Social History  Substance Use Topics  . Smoking status: Never Smoker  . Smokeless tobacco: Not on file  . Alcohol use No   family history is not on file.  ROS as above:  Medications: Current Outpatient Prescriptions  Medication Sig Dispense Refill  . apixaban (ELIQUIS) 5 MG TABS tablet Take 5 mg by mouth.    Marland Kitchen. atorvastatin (LIPITOR) 40 MG tablet Take 40 mg by mouth.    . diltiazem (CARDIZEM CD) 360 MG 24 hr capsule Take 360 mg by mouth.    Marland Kitchen. lisinopril (PRINIVIL,ZESTRIL) 20 MG tablet Take 20 mg by mouth.    . metFORMIN (GLUMETZA) 500 MG (MOD) 24 hr tablet Take 1,000 mg by mouth.     No current facility-administered medications for this visit.    No Known Allergies   Exam:  BP 139/66   Pulse 80   Wt 196 lb (88.9 kg)  Gen: Well NAD Left shoulder: Normal-appearing nontender normal motion negative impingement testing normal strength.  No results found for this or any previous visit (from the past 24 hour(s)). No results found.    Assessment and Plan: 81 y.o. male with left shoulder pain: Exacerbation of subacromial bursitis. Continue home exercise program. Use topical medications as needed.  Flu vaccine given prior to discharge.   Orders Placed This Encounter  Procedures  . Flu vaccine HIGH DOSE PF     Discussed warning signs or symptoms. Please see discharge instructions. Patient expresses understanding.  CC: Daryll DrownKnudson, Mark P, MD  [facility] [address]  [place], [place] [address]  [phone]  [phone] (Fax)

## 2015-10-28 NOTE — Patient Instructions (Signed)
Thank you for coming in today. Return as needed.  Continue home exercise program.

## 2015-10-29 NOTE — Progress Notes (Signed)
Notes routed to PCP via epic.

## 2016-06-01 ENCOUNTER — Ambulatory Visit (INDEPENDENT_AMBULATORY_CARE_PROVIDER_SITE_OTHER): Payer: Medicare Other | Admitting: Family Medicine

## 2016-06-01 VITALS — BP 130/59 | HR 73 | Wt 194.0 lb

## 2016-06-01 DIAGNOSIS — M7550 Bursitis of unspecified shoulder: Secondary | ICD-10-CM | POA: Diagnosis not present

## 2016-06-01 NOTE — Progress Notes (Signed)
   Henry Matthews is a 82 y.o. male who presents to [facility] today for left shoulder pain. Patient was seen previously for left shoulder pain thought to be due to subacromial bursitis. He last was seen in September. He is doing well until about a week ago when he noted worsening pain in the lateral upper arm worse with overhead motion. He restarted his home rotator cuff strengthening program and is feeling better today. He is almost pain-free. He denies any radiating pain weakness or numbness.   No past medical history on file. No past surgical history on file. Social History  Substance Use Topics  . Smoking status: Never Smoker  . Smokeless tobacco: Not on file  . Alcohol use No     ROS:  As above   Medications: Current Outpatient Prescriptions  Medication Sig Dispense Refill  . apixaban (ELIQUIS) 5 MG TABS tablet Take 5 mg by mouth.    Marland Kitchen atorvastatin (LIPITOR) 40 MG tablet Take 40 mg by mouth.    . diltiazem (CARDIZEM CD) 360 MG 24 hr capsule Take 360 mg by mouth.    Marland Kitchen lisinopril (PRINIVIL,ZESTRIL) 20 MG tablet Take 20 mg by mouth.    . metFORMIN (GLUMETZA) 500 MG (MOD) 24 hr tablet Take 1,000 mg by mouth.     No current facility-administered medications for this visit.    No Known Allergies   Exam:  BP (!) 130/59   Pulse 73   Wt 194 lb (88 kg)   SpO2 99%  General: Well Developed, well nourished, and in no acute distress.  Neuro/Psych: Alert and oriented x3, extra-ocular muscles intact, able to move all 4 extremities, sensation grossly intact. Skin: Warm and dry, no rashes noted.  Respiratory: Not using accessory muscles, speaking in full sentences, trachea midline.  Cardiovascular: Pulses palpable, no extremity edema. Abdomen: Does not appear distended. MSK: Left shoulder normal appearing normal motion negative impingement.    No results found for this or any previous visit (from the past 48 hour(s)). No results  found.    Assessment and Plan: 82 y.o. male with left shoulder pain likely due to rotator cuff dysfunction and subacromial bursitis. Recommend continued home exercise program if worsening or no improvement would proceed with injection.    No orders of the defined types were placed in this encounter.   Discussed warning signs or symptoms. Please see discharge instructions. Patient expresses understanding.

## 2016-06-01 NOTE — Patient Instructions (Signed)
Thank you for coming in today. Keep working on the rotator cuff band exercises.  If the pain returns and does not go away we will do shot.

## 2016-11-24 ENCOUNTER — Other Ambulatory Visit (INDEPENDENT_AMBULATORY_CARE_PROVIDER_SITE_OTHER): Payer: Medicare Other | Admitting: Family Medicine

## 2016-11-24 DIAGNOSIS — Z23 Encounter for immunization: Secondary | ICD-10-CM

## 2017-05-23 ENCOUNTER — Other Ambulatory Visit: Payer: Self-pay

## 2017-05-23 ENCOUNTER — Ambulatory Visit (INDEPENDENT_AMBULATORY_CARE_PROVIDER_SITE_OTHER): Payer: Medicare Other | Admitting: Family Medicine

## 2017-05-23 VITALS — BP 125/49 | HR 58 | Temp 98.0°F | Resp 16 | Wt 199.0 lb

## 2017-05-23 DIAGNOSIS — M7061 Trochanteric bursitis, right hip: Secondary | ICD-10-CM | POA: Diagnosis not present

## 2017-05-23 NOTE — Patient Instructions (Signed)
Thank you for coming in today. Send me a message in 2 weeks.  If not a lot better next I will order PT.  Work on the stretching and strength exercises.  Recheck as needed if all is well.    Hip Bursitis Hip bursitis is swelling of a fluid-filled sac (bursa) in your hip. This swelling (inflammation) can be painful. This condition may come and go over time. Follow these instructions at home: Medicines  Take over-the-counter and prescription medicines only as told by your doctor.  Do not drive or use heavy machinery while taking prescription pain medicine, or as told by your doctor.  If you were prescribed an antibiotic medicine, take it as told by your doctor. Do not stop taking the antibiotic even if you start to feel better. Activity  Return to your normal activities as told by your doctor. Ask your doctor what activities are safe for you.  Rest and protect your hip until you feel better. General instructions  Wear wraps that put pressure on your hip (compression wraps) only as told by your doctor.  Raise (elevate) your hip above the level of your heart as much as you can. To do this, try putting a pillow under your hips while you lie down. Stop if this causes pain.  Do not use your hip to support your body weight until your doctor says that you can.  Use crutches as told by your doctor.  Gently rub and stretch your injured area as often as is comfortable.  Keep all follow-up visits as told by your doctor. This is important. How is this prevented?  Exercise regularly, as told by your doctor.  Warm up and stretch before being active.  Cool down and stretch after being active.  Avoid activities that bother your hip or cause pain.  Avoid sitting down for long periods at a time. Contact a doctor if:  You have a fever.  You get new symptoms.  You have trouble walking.  You have trouble doing everyday activities.  You have pain that gets worse.  You have pain that  does not get better with medicine.  You get red skin on your hip area.  You get a feeling of warmth in your hip area. Get help right away if:  You cannot move your hip.  You have very bad pain. This information is not intended to replace advice given to you by your health care provider. Make sure you discuss any questions you have with your health care provider. Document Released: 02/20/2010 Document Revised: 06/26/2015 Document Reviewed: 08/20/2014 Elsevier Interactive Patient Education  [employer]2018 [employer]

## 2017-05-23 NOTE — Progress Notes (Signed)
CC  Milus MallickHughes, Samuel Kelse, MD  [address]  [place], [place] [address]  [phone]  [phone] (Fax)

## 2017-05-23 NOTE — Progress Notes (Signed)
       Henry Matthews is a 83 y.o. male who [facility] today for hip pain. Patient reports he is having lateral hip pain occasionally when standing up from a chair. He states the pain is sharp when present but only happens occasionally. He denies radiation down his legs.  He has tried some home exercises which help a bit. No fever or chills.  No NVD. He had a similar symptoms last year and was diagnosed with trochanteric bursitis and treated with home exercises and an injection.    Social History   Tobacco Use  . Smoking status: Never Smoker  Substance Use Topics  . Alcohol use: No    Alcohol/week: 0.0 oz   family history is not on file.  ROS as above:  Medications: Current Outpatient Medications  Medication Sig Dispense Refill  . apixaban (ELIQUIS) 5 MG TABS tablet Take 5 mg by mouth.    Marland Kitchen. atorvastatin (LIPITOR) 40 MG tablet Take 40 mg by mouth.    . diltiazem (CARDIZEM CD) 360 MG 24 hr capsule Take 360 mg by mouth.    Marland Kitchen. lisinopril (PRINIVIL,ZESTRIL) 20 MG tablet Take 20 mg by mouth.    . metFORMIN (GLUMETZA) 500 MG (MOD) 24 hr tablet Take 1,000 mg by mouth.     No current facility-administered medications for this visit.    No Known Allergies  Health Maintenance Health Maintenance  Topic Date Due  . TETANUS/TDAP  07/06/1953  . PNA vac Low Risk Adult (1 of 2 - PCV13) 07/05/2001  . INFLUENZA VACCINE  09/01/2017     Exam:  BP (!) 125/49 (BP Location: Left Arm, Patient Position: Sitting, Cuff Size: Large)   Pulse (!) 58   Temp 98 F (36.7 C)   Resp 16   Wt 199 lb (90.3 kg)   SpO2 100%  Gen: Well NAD HEENT: EOMI,  MMM Lungs: Normal work of breathing. CTABL Heart: RRR no MRG Abd: NABS, Soft. Nondistended, Nontender Exts: Brisk capillary refill, warm and well perfused.   Right Hip: No erythema, deformities or obvious effusions.    Tenderness to palpation at the lateral hip.  ROM normal. Strength 5/5 to abduction.   Hip greater trochanteric injection: Right Consent obtained and timeout performed. Area of maximum tenderness palpated and identified. Skin cleaned with alcohol, cold spray applied. A 25 gauge needle was used to access the greater trochanteric bursa. 80 mg of kenalog and 4 mL of Marcaine were used to inject the trochanteric bursa. Patient tolerated the procedure well.     No results found for this or any previous visit (from the past 72 hour(s)). No results found.    Assessment and Plan: 83 y.o. male with hip pain.   Patient presents with trochanter bursitis. Previous injections have alleviated the patient's pain. Today we did a steroid injection. He should experience pain relief in 2-3 weeks. He should report back if the symptoms persist and we can discuss physical therapy.  Continue home exercise plan.    No orders of the defined types were placed in this encounter.  No orders of the defined types were placed in this encounter.    Discussed warning signs or symptoms. Please see discharge instructions. Patient expresses understanding.

## 2017-05-24 NOTE — Progress Notes (Signed)
Faxed

## 2017-06-07 ENCOUNTER — Telehealth: Payer: Self-pay | Admitting: Family Medicine

## 2017-06-07 NOTE — Telephone Encounter (Signed)
Patient came in to let you know how his hip is feeling. He stated that his hip is feeling better; however, it still hurts when he first gets up to move around (from laying or sitting). Once he moves around for a few minutes it feels much better.

## 2017-06-07 NOTE — Telephone Encounter (Signed)
Great news.

## 2017-06-24 ENCOUNTER — Other Ambulatory Visit: Payer: Self-pay

## 2017-06-24 ENCOUNTER — Emergency Department
Admission: EM | Admit: 2017-06-24 | Discharge: 2017-06-24 | Disposition: A | Discharge status: Home / Self Care | Payer: Medicare Other | Source: Home / Self Care

## 2017-06-24 ENCOUNTER — Encounter: Payer: Self-pay | Admitting: *Deleted

## 2017-06-24 DIAGNOSIS — J209 Acute bronchitis, unspecified: Secondary | ICD-10-CM

## 2017-06-24 HISTORY — DX: Type 2 diabetes mellitus without complications: E11.9

## 2017-06-24 HISTORY — DX: Essential (primary) hypertension: I10

## 2017-06-24 HISTORY — DX: Hyperlipidemia, unspecified: E78.5

## 2017-06-24 MED ORDER — AMOXICILLIN 875 MG PO TABS: 875.0000 mg | ORAL_TABLET | Freq: Two times a day (BID) | ORAL | 0 refills | Status: DC

## 2017-06-24 NOTE — ED Triage Notes (Signed)
Patient c/o 2 weeks of productive cough, and nasal congestion. Using Mucinex otc.

## 2017-06-24 NOTE — Discharge Instructions (Signed)
Take medications as instructed. If you develop shortness of breath or chest pain please go to the emergency room.

## 2017-06-24 NOTE — ED Provider Notes (Signed)
[facility]    CSN: [id number] Arrival date & time: 06/24/17  1121     History   Chief Complaint Chief Complaint  Patient presents with  . Nasal Congestion  . Cough    HPI Henry Matthews is a 83 y.o. male.  Patient states he has had chest congestion for the last 2 weeks.  He has been taking Mucinex without much improvement.  He denies chest pain or shortness of breath.  The phlegm he coughs up is a whitish-green color.  He coughs this up frequently during the day.  He had a CT scan done of his chest in March and this showed thickening of the fissures on his chest x-ray.  He has regular cardiology appointments.  He has some nasal congestion with this.  Cough  Associated symptoms: no fever, no shortness of breath, no sore throat and no wheezing     Past Medical History:  Diagnosis Date  . Diabetes mellitus without complication (HCC)   . Hyperlipidemia   . Hypertension     Patient Active Problem List   Diagnosis Date Noted  . Subacromial bursitis 02/19/2015    History reviewed. No pertinent surgical history.     Home Medications    Prior to Admission medications   Medication Sig Start Date End Date Taking? Authorizing Provider  amoxicillin (AMOXIL) 875 MG tablet Take 1 tablet (875 mg total) by mouth 2 (two) times daily. 06/24/17   Collene Gobble, MD  apixaban (ELIQUIS) 5 MG TABS tablet Take 5 mg by mouth.    [provider]  atorvastatin (LIPITOR) 40 MG tablet Take 40 mg by mouth.    [provider]  diltiazem (CARDIZEM CD) 360 MG 24 hr capsule Take 360 mg by mouth.    [provider]  lisinopril (PRINIVIL,ZESTRIL) 20 MG tablet Take 20 mg by mouth.    [provider]  metFORMIN (GLUMETZA) 500 MG (MOD) 24 hr tablet Take 1,000 mg by mouth.    [provider]    Family History History reviewed. No pertinent family history.  Social History Social History   Tobacco Use  . Smoking status: Never Smoker  .  Smokeless tobacco: Never Used  Substance Use Topics  . Alcohol use: No    Alcohol/week: 0.0 oz  . Drug use: No     Allergies   Patient has no known allergies.   Review of Systems Review of Systems  Constitutional: Negative for activity change and fever.  HENT: Positive for congestion. Negative for sinus pain and sore throat.   Eyes: Negative.   Respiratory: Positive for cough. Negative for shortness of breath and wheezing.   Cardiovascular: Negative.   Gastrointestinal: Negative.      Physical Exam Triage Vital Signs ED Triage Vitals [06/24/17 1147]  Enc Vitals Group     BP 118/61     Pulse Rate 64     Resp 16     Temp 98.3 F (36.8 C)     Temp Source Oral     SpO2 96 %     Weight 190 lb (86.2 kg)     Height      Head Circumference      Peak Flow      Pain Score 0     Pain Loc      Pain Edu?      Excl. in GC?    No data found.  Updated Vital Signs BP 118/61 (BP Location: Right Arm)   Pulse  64   Temp 98.3 F (36.8 C) (Oral)   Resp 16   Wt 190 lb (86.2 kg)   SpO2 96%   Visual Acuity Right Eye Distance:   Left Eye Distance:   Bilateral Distance:    Right Eye Near:   Left Eye Near:    Bilateral Near:     Physical Exam  Constitutional: He appears well-developed and well-nourished.  HENT:  There is nasal congestion noted.  The posterior pharynx is normal.  Neck: Normal range of motion. No thyromegaly present.  Cardiovascular: Normal rate.  Pulmonary/Chest: Effort normal.  There are rhonchi noted bilaterally.  Breath sounds are symmetrical.  There did not appear to be any areas of dullness.  No wheezes.  Lymphadenopathy:    He has no cervical adenopathy.  Skin: Skin is warm and dry.     UC Treatments / Results  Labs (all labs ordered are listed, but only abnormal results are displayed) Labs Reviewed - No data to display  EKG None  Radiology No results found.  Procedures Procedures (including critical care time)  Medications Ordered  in UC Medications - No data to display  Initial Impression / Assessment and Plan / UC Course  I have reviewed the triage vital signs and the nursing notes.  Pertinent labs & imaging results that were available during my care of the patient were reviewed by me and considered in my medical decision making (see chart for details). Patient has developed a bronchitis.  Will treat with amoxicillin twice a day for 7 days.  He has an abnormal chest x-ray with pleural thickening bilaterally and is scheduled for a CT scan next month so I did not feel doing a chest x-ray would be helpful in deciding on his treatment.  He agrees and will follow-up with his primary care physician.     Final Clinical Impressions(s) / UC Diagnoses   Final diagnoses:  Acute bronchitis, unspecified organism     Discharge Instructions     Take medications as instructed. If you develop shortness of breath or chest pain please go to the emergency room.    ED Prescriptions    Medication Sig Dispense Auth. Provider   amoxicillin (AMOXIL) 875 MG tablet Take 1 tablet (875 mg total) by mouth 2 (two) times daily. 14 tablet Collene Gobble, MD     Controlled Substance Prescriptions [place] Controlled Substance Registry consulted? Not Applicable   Collene Gobble, MD 06/24/17 1440

## 2017-09-13 ENCOUNTER — Other Ambulatory Visit: Payer: Self-pay

## 2017-09-13 ENCOUNTER — Emergency Department (INDEPENDENT_AMBULATORY_CARE_PROVIDER_SITE_OTHER)
Admission: EM | Admit: 2017-09-13 | Discharge: 2017-09-13 | Disposition: A | Discharge status: Home / Self Care | Payer: Medicare Other | Source: Home / Self Care | Attending: Family Medicine | Admitting: Family Medicine

## 2017-09-13 DIAGNOSIS — L739 Follicular disorder, unspecified: Secondary | ICD-10-CM | POA: Diagnosis not present

## 2017-09-13 MED ORDER — DOXYCYCLINE HYCLATE 100 MG PO CAPS: 100.0000 mg | ORAL_CAPSULE | Freq: Two times a day (BID) | ORAL | 0 refills | Status: AC

## 2017-09-13 NOTE — Discharge Instructions (Addendum)
May apply Benadryl cream and/or 1% hydrocortisone cream as needed for itching.

## 2017-09-13 NOTE — ED Triage Notes (Signed)
Pt c/o itchy rash in groin area and on both ankles and backside. Tried benedryl cream with little relief.

## 2017-09-13 NOTE — ED Provider Notes (Signed)
[facility]    CSN: [id number] Arrival date & time: 09/13/17  0913     History   Chief Complaint Chief Complaint  Patient presents with  . Rash    Groin area    HPI Henry Matthews is a 83 y.o. male.   Patient complains of a two week history of pruritic rash that started in his groin area, and now has spread to lower legs.  He recalls no insect bites or contact with allergens.  He feels well otherwise.  The history is provided by the patient.  Rash  Location: groin area and lower legs. Quality: dryness, itchiness and redness   Quality: not blistering, not bruising, not burning, not draining, not painful, not peeling, not scaling, not swelling and not weeping   Severity:  Mild Onset quality:  Gradual Duration:  2 weeks Timing:  Constant Progression:  Spreading Chronicity:  New Context: not animal contact, not chemical exposure, not exposure to similar rash, not food, not hot tub use, not insect bite/sting, not medications, not new detergent/soap, not nuts and not plant contact   Relieved by:  Nothing Worsened by:  Nothing Ineffective treatments:  Anti-itch cream Associated symptoms: no abdominal pain, no diarrhea, no fatigue, no fever, no headaches, no induration, no joint pain, no myalgias, no nausea and no sore throat     Past Medical History:  Diagnosis Date  . Diabetes mellitus without complication (HCC)   . Hyperlipidemia   . Hypertension     Patient Active Problem List   Diagnosis Date Noted  . Subacromial bursitis 02/19/2015    History reviewed. No pertinent surgical history.     Home Medications    Prior to Admission medications   Medication Sig Start Date End Date Taking? Authorizing Provider  amoxicillin (AMOXIL) 875 MG tablet Take 1 tablet (875 mg total) by mouth 2 (two) times daily. 06/24/17   Collene Gobbleaub, Steven A, MD  apixaban (ELIQUIS) 5 MG TABS tablet Take 5 mg by mouth.    [provider]  atorvastatin (LIPITOR) 40 MG tablet  Take 40 mg by mouth.    [provider]  diltiazem (CARDIZEM CD) 360 MG 24 hr capsule Take 360 mg by mouth.    [provider]  doxycycline (VIBRAMYCIN) 100 MG capsule Take 1 capsule (100 mg total) by mouth 2 (two) times daily. Take with food. 09/13/17   Lattie HawBeese, Lynsee Wands A, MD  lisinopril (PRINIVIL,ZESTRIL) 20 MG tablet Take 20 mg by mouth.    [provider]  metFORMIN (GLUMETZA) 500 MG (MOD) 24 hr tablet Take 1,000 mg by mouth.    [provider]    Family History History reviewed. No pertinent family history.  Social History Social History   Tobacco Use  . Smoking status: Never Smoker  . Smokeless tobacco: Never Used  Substance Use Topics  . Alcohol use: No    Alcohol/week: 0.0 standard drinks  . Drug use: No     Allergies   Patient has no known allergies.   Review of Systems Review of Systems  Constitutional: Negative for fatigue and fever.  HENT: Negative for sore throat.   Gastrointestinal: Negative for abdominal pain, diarrhea and nausea.  Musculoskeletal: Negative for arthralgias and myalgias.  Skin: Positive for rash.  Neurological: Negative for headaches.  All other systems reviewed and are negative.    Physical Exam Triage Vital Signs ED Triage Vitals [09/13/17 0930]  Enc Vitals Group     BP 109/61     Pulse Rate Marland Kitchen(!)  43     Resp      Temp 98.4 F (36.9 C)     Temp Source Oral     SpO2 98 %     Weight 194 lb (88 kg)     Height 5\' 9"  (1.753 m)     Head Circumference      Peak Flow      Pain Score 0     Pain Loc      Pain Edu?      Excl. in GC?    No data found.  Updated Vital Signs BP 109/61 (BP Location: Right Arm)   Pulse (!) 43   Temp 98.4 F (36.9 C) (Oral)   Ht 5\' 9"  (1.753 m)   Wt 88 kg   SpO2 98%   BMI 28.65 kg/m   Visual Acuity Right Eye Distance:   Left Eye Distance:   Bilateral Distance:    Right Eye Near:   Left Eye Near:    Bilateral Near:     Physical Exam  Constitutional: He  appears well-developed and well-nourished. No distress.  HENT:  Head: Normocephalic.  Right Ear: External ear normal.  Left Ear: External ear normal.  Nose: Nose normal.  Mouth/Throat: Oropharynx is clear and moist.  Eyes: Pupils are equal, round, and reactive to light. Conjunctivae are normal.  Cardiovascular: Normal heart sounds.  Pulmonary/Chest: Breath sounds normal.  Abdominal: Soft. There is no tenderness.  Musculoskeletal: He exhibits no edema.  Lymphadenopathy:    He has no cervical adenopathy.  Neurological: He is alert.  Skin: Skin is warm and dry. Rash noted.     In the inguinal area, upper anterior thighs, and lower legs there are scattered follicular erythematous papules with tiny central pustules.  Nursing note and vitals reviewed.    UC Treatments / Results  Labs (all labs ordered are listed, but only abnormal results are displayed) Labs Reviewed - No data to display  EKG None  Radiology No results found.  Procedures Procedures (including critical care time)  Medications Ordered in UC Medications - No data to display  Initial Impression / Assessment and Plan / UC Course  I have reviewed the triage vital signs and the nursing notes.  Pertinent labs & imaging results that were available during my care of the patient were reviewed by me and considered in my medical decision making (see chart for details).    Begin doxycycline for staph coverage. Followup with dermatologist if not improving one week.  Final Clinical Impressions(s) / UC Diagnoses   Final diagnoses:  Folliculitis     Discharge Instructions     May apply Benadryl cream and/or 1% hydrocortisone cream as needed for itching.    ED Prescriptions    Medication Sig Dispense Auth. Provider   doxycycline (VIBRAMYCIN) 100 MG capsule Take 1 capsule (100 mg total) by mouth 2 (two) times daily. Take with food. 14 capsule Lattie HawBeese, Josephina Melcher A, MD         Lattie HawBeese, Ordean Fouts A, MD 09/13/17  1000

## 2017-10-10 ENCOUNTER — Emergency Department
Admission: EM | Admit: 2017-10-10 | Discharge: 2017-10-10 | Discharge status: Absent without leave | Payer: Medicare Other | Source: Home / Self Care

## 2018-11-27 ENCOUNTER — Other Ambulatory Visit: Payer: Self-pay

## 2018-11-27 ENCOUNTER — Ambulatory Visit (INDEPENDENT_AMBULATORY_CARE_PROVIDER_SITE_OTHER): Payer: Medicare Other | Admitting: Family Medicine

## 2018-11-27 ENCOUNTER — Encounter: Payer: Self-pay | Admitting: Family Medicine

## 2018-11-27 ENCOUNTER — Ambulatory Visit: Payer: Medicare Other | Admitting: Family Medicine

## 2018-11-27 VITALS — BP 121/62 | HR 69 | Temp 98.2°F | Wt 193.0 lb

## 2018-11-27 DIAGNOSIS — M7061 Trochanteric bursitis, right hip: Secondary | ICD-10-CM | POA: Diagnosis not present

## 2018-11-27 NOTE — Patient Instructions (Signed)
Thank you for coming in today. Call or go to the ER if you develop a large red swollen joint with extreme pain or oozing puss.  I think this is trochanteric bursitis.  Work on the side leg raises 30 reps 2-3x daily.  Do the standing stretch. Remember to bend the back knee a little.  I do not think this is shingles. THe rash is likley due to the heating pad. However if you get blisters on the rash let me know and I will prescribe valtrex.    Hip Bursitis Rehab Ask your health care provider which exercises are safe for you. Do exercises exactly as told by your health care provider and adjust them as directed. It is normal to feel mild stretching, pulling, tightness, or discomfort as you do these exercises. Stop right away if you feel sudden pain or your pain gets worse. Do not begin these exercises until told by your health care provider. Stretching exercise This exercise warms up your muscles and joints and improves the movement and flexibility of your hip. This exercise also helps to relieve pain and stiffness. Iliotibial band stretch An iliotibial band is a strong band of muscle tissue that runs from the outer side of your hip to the outer side of your thigh and knee. 1. Lie on your side with your left / right leg in the top position. 2. Bend your left / right knee and grab your ankle. Stretch out your bottom arm to help you balance. 3. Slowly bring your knee back so your thigh is behind your body. 4. Slowly lower your knee toward the floor until you feel a gentle stretch on the outside of your left / right thigh. If you do not feel a stretch and your knee will not fall farther, place the heel of your other foot on top of your knee and pull your knee down toward the floor with your foot. 5. Hold this position for __________ seconds. 6. Slowly return to the starting position. Repeat __________ times. Complete this exercise __________ times a day. Strengthening exercises These exercises build  strength and endurance in your hip and pelvis. Endurance is the ability to use your muscles for a long time, even after they get tired. Bridge This exercise strengthens the muscles that move your thigh backward (hip extensors). 1. Lie on your back on a firm surface with your knees bent and your feet flat on the floor. 2. Tighten your buttocks muscles and lift your buttocks off the floor until your trunk is level with your thighs. ? Do not arch your back. ? You should feel the muscles working in your buttocks and the back of your thighs. If you do not feel these muscles, slide your feet 1-2 inches (2.5-5 cm) farther away from your buttocks. ? If this exercise is too easy, try doing it with your arms crossed over your chest. 3. Hold this position for __________ seconds. 4. Slowly lower your hips to the starting position. 5. Let your muscles relax completely after each repetition. Repeat __________ times. Complete this exercise __________ times a day. Squats This exercise strengthens the muscles in front of your thigh and knee (quadriceps). 1. Stand in front of a table, with your feet and knees pointing straight ahead. You may rest your hands on the table for balance but not for support. 2. Slowly bend your knees and lower your hips like you are going to sit in a chair. ? Keep your weight over your heels, not  over your toes. ? Keep your lower legs upright so they are parallel with the table legs. ? Do not let your hips go lower than your knees. ? Do not bend lower than told by your health care provider. ? If your hip pain increases, do not bend as low. 3. Hold the squat position for __________ seconds. 4. Slowly push with your legs to return to standing. Do not use your hands to pull yourself to standing. Repeat __________ times. Complete this exercise __________ times a day. Hip hike 1. Stand sideways on a bottom step. Stand on your left / right leg with your other foot unsupported next to the  step. You can hold on to the railing or wall for balance if needed. 2. Keep your knees straight and your torso square. Then lift your left / right hip up toward the ceiling. 3. Hold this position for __________ seconds. 4. Slowly let your left / right hip lower toward the floor, past the starting position. Your foot should get closer to the floor. Do not lean or bend your knees. Repeat __________ times. Complete this exercise __________ times a day. Single leg stand 1. Without shoes, stand near a railing or in a doorway. You may hold on to the railing or door frame as needed for balance. 2. Squeeze your left / right buttock muscles, then lift up your other foot. ? Do not let your left / right hip push out to the side. ? It is helpful to stand in front of a mirror for this exercise so you can watch your hip. 3. Hold this position for __________ seconds. Repeat __________ times. Complete this exercise __________ times a day. This information is not intended to replace advice given to you by your health care provider. Make sure you discuss any questions you have with your health care provider. Document Released: 02/26/2004 Document Revised: 05/15/2018 Document Reviewed: 05/15/2018 Elsevier Patient Education  2020 [employer].

## 2018-11-27 NOTE — Progress Notes (Signed)
Henry Matthews is a 84 y.o. male who presents to [facility] today for right lateral hip pain. Exam patient developed right lateral hip pain starting Friday, October 24.  He does not recall any injury.  He denies any pain radiating down his leg weakness or numbness distally.  Pain is worse when he first stands up from a chair and is for starts walking.  The pain improves after walking for a bit.  He was seen for similar episode of pain in April 2019 thought to be trochanteric bursitis.  This was treated with a steroid injection and home exercise program it works well until recently.  He also notes has been using a heating pad.  His pain is not consistent with previous episodes of shingles.     ROS:  As above  Exam:  BP 121/62   Pulse 69   Temp 98.2 F (36.8 C) (Oral)   Wt 193 lb (87.5 kg)   BMI 28.50 kg/m  Wt Readings from Last 5 Encounters:  11/27/18 193 lb (87.5 kg)  09/13/17 194 lb (88 kg)  06/24/17 190 lb (86.2 kg)  05/23/17 199 lb (90.3 kg)  06/01/16 194 lb (88 kg)   General: Well Developed, well nourished, and in no acute distress.  Neuro/Psych: Alert and oriented x3, extra-ocular muscles intact, able to move all 4 extremities, sensation grossly intact. Skin: Warm and dry, no rashes noted.  Respiratory: Not using accessory muscles, speaking in full sentences, trachea midline.  Cardiovascular: Pulses palpable, no extremity edema. Abdomen: Does not appear distended. MSK:  Right hip: Small erythematous patchy rash on buttocks otherwise normal-appearing. Normal range of motion. Strength abduction 4/5.  External rotation 5/5. Mildly painful. Mild antalgic gait. Pulses cap refill and sensation are intact distally.      Lab and Radiology Results Hip greater trochanteric injection: Right  Consent obtained and timeout performed. Area of maximum tenderness palpated and identified. Skin cleaned with alcohol, cold spray applied.  A spinal needle was used to access the greater trochanteric bursa. 80 mg of Kenalog, and 2 mL of Marcaine and 1 mL of lidocaine were used to inject the trochanteric bursa. Patient tolerated the procedure well. Pain improved following injection.    Assessment and Plan: 84 y.o. male with right lateral hip and buttocks pain.  Very likely trochanteric bursitis.  Although patient does have a rash shingles is less likely.  I believe the rash is likely secondary to the heating pad.  Treat with injection and home exercise program.  However if he develops radiating pain or rash worsens or develops vesicles we will treat empirically with Valtrex.  Recheck as needed.  Precautions reviewed.   PDMP not reviewed this encounter. No orders of the defined types were placed in this encounter.  No orders of the defined types were placed in this encounter.   Historical information moved to improve visibility of documentation.  Past Medical History:  Diagnosis Date  . Diabetes mellitus without complication (HCC)   . Hyperlipidemia   . Hypertension    No past surgical history on file. Social History   Tobacco Use  . Smoking status: Never Smoker  . Smokeless tobacco: Never Used  Substance Use Topics  . Alcohol use: No    Alcohol/week: 0.0 standard drinks   family history is not on file.  Medications: Current Outpatient Medications  Medication Sig Dispense Refill  . amoxicillin (AMOXIL) 875 MG tablet Take 1 tablet (875 mg total) by mouth 2 (two)  times daily. 14 tablet 0  . apixaban (ELIQUIS) 5 MG TABS tablet Take 5 mg by mouth.    Marland Kitchen atorvastatin (LIPITOR) 40 MG tablet Take 40 mg by mouth.    . diltiazem (CARDIZEM CD) 360 MG 24 hr capsule Take 360 mg by mouth.    . doxycycline (VIBRAMYCIN) 100 MG capsule Take 1 capsule (100 mg total) by mouth 2 (two) times daily. Take with food. 14 capsule 0  . lisinopril (PRINIVIL,ZESTRIL) 20 MG tablet Take 20 mg by mouth.    . metFORMIN (GLUMETZA) 500 MG (MOD)  24 hr tablet Take 1,000 mg by mouth.     No current facility-administered medications for this visit.    No Known Allergies    Discussed warning signs or symptoms. Please see discharge instructions. Patient expresses understanding.

## 2019-04-11 ENCOUNTER — Other Ambulatory Visit: Payer: Self-pay

## 2019-04-11 ENCOUNTER — Encounter: Payer: Self-pay | Admitting: Sports Medicine

## 2019-04-11 ENCOUNTER — Ambulatory Visit (INDEPENDENT_AMBULATORY_CARE_PROVIDER_SITE_OTHER): Payer: Medicare Other | Admitting: Sports Medicine

## 2019-04-11 ENCOUNTER — Ambulatory Visit (INDEPENDENT_AMBULATORY_CARE_PROVIDER_SITE_OTHER): Payer: Medicare Other

## 2019-04-11 DIAGNOSIS — M7541 Impingement syndrome of right shoulder: Secondary | ICD-10-CM | POA: Diagnosis not present

## 2019-04-11 NOTE — Assessment & Plan Note (Signed)
Henry Matthews returns, he is a very pleasant 85 year old male, he has right shoulder impingement syndrome, he was injected by Dr. Denyse Amass back in 2017 did well until recently. Now having recurrence of pain, repeat right subacromial injection performed, home exercises given, I would like some baseline x-rays. Return to see me in 1 month if needed.

## 2019-04-11 NOTE — Progress Notes (Signed)
    Procedures performed today:    Procedure: Real-time Ultrasound Guided injection of the right subacromial bursa Device: Samsung HS60  Verbal informed consent obtained.  Time-out conducted.  Noted no overlying erythema, induration, or other signs of local infection.  Skin prepped in a sterile fashion.  Local anesthesia: Topical Ethyl chloride.  With sterile technique and under real time ultrasound guidance: 1 cc Kenalog 40, 1 cc lidocaine, 1 cc bupivacaine injected easily Completed without difficulty  Pain immediately resolved suggesting accurate placement of the medication.  Advised to call if fevers/chills, erythema, induration, drainage, or persistent bleeding.  Images permanently stored and available for review in the ultrasound unit.  Impression: Technically successful ultrasound guided injection.  Independent interpretation of notes and tests performed by another provider:   None.  Impression and Recommendations:    Impingement syndrome, shoulder, right Henry Matthews returns, he is a very pleasant 85 year old male, he has right shoulder impingement syndrome, he was injected by Dr. Denyse Amass back in 2017 did well until recently. Now having recurrence of pain, repeat right subacromial injection performed, home exercises given, I would like some baseline x-rays. Return to see me in 1 month if needed.    ___________________________________________ Henry Matthews. Benjamin Stain, M.D., ABFM., CAQSM. Primary Care and Sports Medicine [facility]  Adjunct Instructor of Family Medicine  [employer]

## 2019-04-23 ENCOUNTER — Ambulatory Visit (INDEPENDENT_AMBULATORY_CARE_PROVIDER_SITE_OTHER): Payer: Medicare Other

## 2019-04-23 ENCOUNTER — Ambulatory Visit (INDEPENDENT_AMBULATORY_CARE_PROVIDER_SITE_OTHER): Payer: Medicare Other | Admitting: Sports Medicine

## 2019-04-23 ENCOUNTER — Encounter: Payer: Self-pay | Admitting: Sports Medicine

## 2019-04-23 ENCOUNTER — Other Ambulatory Visit: Payer: Self-pay

## 2019-04-23 DIAGNOSIS — M7541 Impingement syndrome of right shoulder: Secondary | ICD-10-CM | POA: Diagnosis not present

## 2019-04-23 DIAGNOSIS — R252 Cramp and spasm: Secondary | ICD-10-CM | POA: Diagnosis not present

## 2019-04-23 DIAGNOSIS — M48061 Spinal stenosis, lumbar region without neurogenic claudication: Secondary | ICD-10-CM | POA: Insufficient documentation

## 2019-04-23 MED ORDER — MAGNESIUM OXIDE 400 MG PO TABS: 800.0000 mg | ORAL_TABLET | Freq: Every day | ORAL | 3 refills | Status: DC

## 2019-04-23 NOTE — Assessment & Plan Note (Signed)
This is a pleasant 85 year old male, he has right shoulder impingement symptoms, he was injected by Dr. Denyse Amass back in 2017 and did well, I injected his right subacromial bursa at the last visit and he returns today for the most part pain-free, he gets a bit of crepitus, but this is asymptomatic. Return as needed for this.

## 2019-04-23 NOTE — Progress Notes (Signed)
    Procedures performed today:    None.  Independent interpretation of notes and tests performed by another provider:   None.  Impression and Recommendations:    Impingement syndrome, shoulder, right This is a pleasant 85 year old male, he has right shoulder impingement symptoms, he was injected by Dr. Denyse Amass back in 2017 and did well, I injected his right subacromial bursa at the last visit and he returns today for the most part pain-free, he gets a bit of crepitus, but this is asymptomatic. Return as needed for this.  Bilateral leg cramps Henry Matthews has noted bilateral cramping in his thighs and legs, he does feel as though his hamstrings are somewhat tight. He likely has some element of spinal stenosis as well. X-rays, magnesium at bedtime, formal PT for his back and to work on his hamstring tightness. Return to see me in 6 weeks for this.    ___________________________________________ Henry Matthews. Benjamin Stain, M.D., ABFM., CAQSM. Primary Care and Sports Medicine [facility]  Adjunct Instructor of Family Medicine  [employer]

## 2019-04-23 NOTE — Assessment & Plan Note (Signed)
Henry Matthews has noted bilateral cramping in his thighs and legs, he does feel as though his hamstrings are somewhat tight. He likely has some element of spinal stenosis as well. X-rays, magnesium at bedtime, formal PT for his back and to work on his hamstring tightness. Return to see me in 6 weeks for this.

## 2019-04-26 ENCOUNTER — Ambulatory Visit (INDEPENDENT_AMBULATORY_CARE_PROVIDER_SITE_OTHER): Payer: Medicare Other | Admitting: Rehabilitative and Restorative Service Providers"

## 2019-04-26 ENCOUNTER — Other Ambulatory Visit: Payer: Self-pay | Admitting: Sports Medicine

## 2019-04-26 ENCOUNTER — Other Ambulatory Visit: Payer: Self-pay

## 2019-04-26 DIAGNOSIS — M79605 Pain in left leg: Secondary | ICD-10-CM | POA: Diagnosis not present

## 2019-04-26 DIAGNOSIS — M25511 Pain in right shoulder: Secondary | ICD-10-CM

## 2019-04-26 DIAGNOSIS — M7541 Impingement syndrome of right shoulder: Secondary | ICD-10-CM

## 2019-04-26 DIAGNOSIS — R29898 Other symptoms and signs involving the musculoskeletal system: Secondary | ICD-10-CM | POA: Diagnosis not present

## 2019-04-26 DIAGNOSIS — R293 Abnormal posture: Secondary | ICD-10-CM | POA: Diagnosis not present

## 2019-04-26 DIAGNOSIS — M79604 Pain in right leg: Secondary | ICD-10-CM | POA: Diagnosis not present

## 2019-04-26 NOTE — Therapy (Signed)
[facility] [place] [place], [place], [address] Phone: [phone]   Fax:  [phone]  Physical Therapy Evaluation  Patient Details  Name: Henry Matthews MRN: [id number] Date of Birth: January 18, 1935 Referring Provider (PT): Dr Dianah Field   Encounter Date: 04/26/2019  PT End of Session - 04/26/19 0905    Visit Number  1    Number of Visits  12    Date for PT Re-Evaluation  06/07/19    PT Start Time  0900    PT Stop Time  0930    PT Time Calculation (min)  30 min    Activity Tolerance  Patient tolerated treatment well       Past Medical History:  Diagnosis Date  . Diabetes mellitus without complication ([place])   . Hyperlipidemia   . Hypertension     No past surgical history on file.  There were no vitals filed for this visit.   Subjective Assessment - 04/26/19 0905    Subjective  Pateint reports that he has had increased pain and tightness in the back of both thighs. He has no known injury. Reports that he has been sitting more in the past year due to the pandemic.    Pertinent History  Rt shoulder pain - has had two injections in the past with some improvement; injection in the Rt hip a few years ago; scrapel in shoulder and hip war ~ 50 yrs ago; macular degeneration; HTN; AODM    Patient Stated Goals  get legs feelig better; decrease pain in the Rt shoulder    Currently in Pain?  Yes    Pain Location  Leg    Pain Orientation  Right;Left    Pain Descriptors / Indicators  Tightness    Pain Type  Acute pain;Chronic pain    Pain Onset  More than a month ago    Pain Frequency  Intermittent    Aggravating Factors   walking and moving    Pain Relieving Factors  sitting    Multiple Pain Sites  Yes    Pain Score  5    Pain Location  Shoulder    Pain Orientation  Right    Pain Descriptors / Indicators  Sharp    Pain Type  Chronic pain    Pain Onset  More than a month ago    Pain Frequency  Intermittent     Aggravating Factors   movement of UE    Pain Relieving Factors  not moving         [facility] PT Assessment - 04/26/19 0001      Assessment   Medical Diagnosis  Bilateral hamstring/leg pain; Rt shoulder pain     Referring Provider (PT)  Dr Dianah Field    Onset Date/Surgical Date  03/19/19   shoulder pain on/off for several years, worse x 1 month     Hand Dominance  Right    Next MD Visit  05/09/19    Prior Therapy  many yrs ago for hip       Precautions   Precautions  None      Balance Screen   Has the patient fallen in the past 6 months  No    Has the patient had a decrease in activity level because of a fear of falling?   No    Is the patient reluctant to leave their home because of a fear of falling?   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       Observation/Other Assessments   Focus on Therapeutic Outcomes (FOTO)   53% limitation       Sensation   Additional Comments  WFL's LE; intermittent tingling Rt hand  per pt report       Posture/Postural Control   Posture Comments  head forward; shoulders rounded and elevated       AROM   Lumbar Flexion  60%   pulling bilat hamstrings    Lumbar Extension  40%    Lumbar - Right Side Bend  60%    Lumbar - Left Side Bend  60%    Lumbar - Right Rotation  45%    Lumbar - Left Rotation  45%      Strength   Overall Strength Comments  WFL's bilat LE's       Flexibility   Hamstrings  RT 50 deg; Lt 45 deg     ITB  tight bilat     Piriformis  tight bilat       Palpation   Palpation comment  tightness noted through the hamstrings bilat                  Objective measurements completed on examination: See above findings.      [facility] Adult PT Treatment/Exercise - 04/26/19 0001      Knee/Hip Exercises: Stretches   Passive Hamstring Stretch  Right;Left;2 reps;30 seconds    Passive Hamstring Stretch Limitations  supine with strap and sitting              PT Education - 04/26/19 0925    Education Details  HEP POC    Person(s) Educated  Patient    Methods  Explanation;Demonstration;Tactile cues;Verbal cues;Handout    Comprehension  Verbalized understanding;Returned demonstration;Verbal cues required;Tactile cues required          PT Long Term Goals - 04/26/19 1053      PT LONG TERM GOAL #1   Title  Improve standing posture with patient demonstrating more upright posture    Time  6    Period  Weeks    Status  New    Target Date  06/07/19      PT LONG TERM GOAL #2   Title  Increase hamstring flexibility to ~ 70 deg bilat    Time  6    Period  Weeks    Status  New    Target Date  06/07/19      PT LONG TERM GOAL #3   Title  Patient to report ability to stand and walk for functional periods of time without pain or limitations    Time  6    Period  Weeks    Status  New    Target Date  06/07/19      PT LONG TERM GOAL #4   Title  Independent in HEP    Time  6    Period  Weeks    Status  New  Target Date  06/07/19      PT LONG TERM GOAL #5   Title  Improve FOTO to </= 33% limitation    Time  6    Period  Weeks    Status  New    Target Date  06/07/19             Plan - 04/26/19 0933    Clinical Impression Statement  Patient presents with bilat posterio thigh/hamstring tightness and limitations with no knowm injury. He also reports intermittent flare up of Rt shoulder pain which has been workse in the past month. Patient has poor posture and alignment; significant muscular tightness through the hamstrings; limited hamstring flexibility; pain with functional activities with decresaed  tolerance and endurance for standing and walking. Further assessment of Rt shoulder will be completed at subsequent visits. Patient will benefit from PT to address problems identified.    Stability/Clinical Decision Making  Stable/Uncomplicated    Clinical Decision Making  Low    Rehab Potential  Good    PT Frequency  2x / week    PT Duration  6 weeks    PT Treatment/Interventions  ADLs/Self Care Home Management;Patient/family education;Cryotherapy;Electrical Stimulation;Iontophoresis 4mg /ml Dexamethasone;Moist Heat;Ultrasound;Gait training;Stair training;Functional mobility training;Therapeutic activities;Therapeutic exercise;Balance training;Neuromuscular re-education;Manual techniques;Passive range of motion;Dry needling;Taping    PT Next Visit Plan  review and progress hamstring stretching; add myofacial ball relesae work for hamstrings in sitting; evaluation of shoulder as ordered by MD; manual work and modalities as indicated    PT Home Exercise Plan  VHI    Consulted and Agree with Plan of Care  Patient       Patient will benefit from skilled therapeutic intervention in order to improve the following deficits and impairments:  Decreased range of motion, Difficulty walking, Increased fascial restricitons, Decreased endurance, Impaired UE functional use, Decreased activity tolerance, Pain, Impaired flexibility, Improper body mechanics, Decreased mobility, Decreased strength, Postural dysfunction  Visit Diagnosis: Pain in left leg - Plan: PT plan of care cert/re-cert  Pain in right leg - Plan: PT plan of care cert/re-cert  Other symptoms and signs involving the musculoskeletal system - Plan: PT plan of care cert/re-cert  Acute pain of right shoulder - Plan: PT plan of care cert/re-cert  Abnormal posture - Plan: PT plan of care cert/re-cert     Problem List Patient Active Problem List   Diagnosis Date Noted  . Bilateral leg cramps 04/23/2019  . Impingement syndrome, shoulder,  right 02/19/2015    Ameira Alessandrini 02/21/2015 PT, MPH  04/26/2019, 11:00 AM  [facility] [address] [place], [place], [place] Phone: [phone]   Fax:  [phone]  Name: Henry Matthews MRN: Rulon Eisenmenger Date of Birth: 05-Mar-1934

## 2019-04-26 NOTE — Patient Instructions (Signed)
Hamstring Step 1    One knee bent, one leg straight, ull straight leg up.Hold _30__ seconds. Relax knee by returning foot to start. Repeat __3_ times. 2-3 times a day    Knee Extension (Hamstring Stretch) With Pelvis Neutral    Prop heel in front of you resting on floor. Be sure pelvis does not tip backward (flex) or rotate. Hinge from your hips  Hold _30__ seconds. Do _3__ times, each leg, _3-4 __ times per day.   Marland Kitchen

## 2019-05-07 ENCOUNTER — Encounter: Payer: Medicare Other | Admitting: Physical Therapy

## 2019-05-09 ENCOUNTER — Ambulatory Visit (INDEPENDENT_AMBULATORY_CARE_PROVIDER_SITE_OTHER): Payer: Medicare Other | Admitting: Sports Medicine

## 2019-05-09 ENCOUNTER — Other Ambulatory Visit: Payer: Self-pay

## 2019-05-09 ENCOUNTER — Encounter: Payer: Self-pay | Admitting: Sports Medicine

## 2019-05-09 ENCOUNTER — Ambulatory Visit (INDEPENDENT_AMBULATORY_CARE_PROVIDER_SITE_OTHER): Payer: Medicare Other

## 2019-05-09 ENCOUNTER — Encounter: Payer: Medicare Other | Admitting: Physical Therapy

## 2019-05-09 DIAGNOSIS — M48062 Spinal stenosis, lumbar region with neurogenic claudication: Secondary | ICD-10-CM | POA: Diagnosis not present

## 2019-05-09 MED ORDER — GABAPENTIN 300 MG PO CAPS: ORAL_CAPSULE | ORAL | 3 refills | Status: AC

## 2019-05-09 NOTE — Progress Notes (Signed)
    Procedures performed today:    None.  Independent interpretation of notes and tests performed by another provider:   None.  Brief History, Exam, Impression, and Recommendations:    Lumbar spinal stenosis Jayron returns, he has bilateral buttock and hamstring pain. We added magnesium oxide at bedtime which did not help. On further questioning he endorses significant cramping when walking, pain in his back, he has to lean forward, this is all consistent with lumbar spinal stenosis. He will continue with physical therapy for at least another month, adding gabapentin at night, discontinue magnesium oxide. 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    ___________________________________________ Ihor Austin. Benjamin Stain, M.D., ABFM., CAQSM. Primary Care and Sports Medicine [facility]  Adjunct Instructor of Family Medicine  [employer]

## 2019-05-09 NOTE — Assessment & Plan Note (Signed)
Henry Matthews returns, he has bilateral buttock and hamstring pain. We added magnesium oxide at bedtime which did not help. On further questioning he endorses significant cramping when walking, pain in his back, he has to lean forward, this is all consistent with lumbar spinal stenosis. He will continue with physical therapy for at least another month, adding gabapentin at night, discontinue magnesium oxide. 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

## 2019-05-09 NOTE — Patient Instructions (Signed)
Spinal Stenosis  Spinal stenosis occurs when the open space (spinal canal) between the bones of your spine (vertebrae) narrows, putting pressure on the spinal cord or nerves. What are the causes? This condition is caused by areas of bone pushing into the central canals of your vertebrae. This condition may be present at birth (congenital), or it may be caused by:  Arthritic deterioration of your vertebrae (spinal degeneration). This usually starts around age 50.  Injury or trauma to the spine.  Tumors in the spine.  Calcium deposits in the spine. What are the signs or symptoms? Symptoms of this condition include:  Pain in the neck or back that is generally worse with activities, particularly when standing and walking.  Numbness, tingling, hot or cold sensations, weakness, or weariness in your legs.  Pain going up and down the leg (sciatica).  Frequent episodes of falling.  A foot-slapping gait that leads to muscle weakness. In more serious cases, you may develop:  Problemspassing stool or passing urine.  Difficulty having sex.  Loss of feeling in part or all of your leg. Symptoms may come on slowly and get worse over time. How is this diagnosed? This condition is diagnosed based on your medical history and a physical exam. Tests will also be done, such as:  MRI.  CT scan.  X-ray. How is this treated? Treatment for this condition often focuses on managing your pain and any other symptoms. Treatment may include:  Practicing good posture to lessen pressure on your nerves.  Exercising to strengthen muscles, build endurance, improve balance, and maintain good joint movement (range of motion).  Losing weight, if needed.  Taking medicines to reduce swelling, inflammation, or pain.  Assistive devices, such as a corset or brace. In some cases, surgery may be needed. The most common procedure is decompression laminectomy. This is done to remove excess bone that puts  pressure on your nerve roots. Follow these instructions at home: Managing pain, stiffness, and swelling  Do all exercises and stretches as told by your health care provider.  Practice good posture. If you were given a brace or a corset, wear it as told by your health care provider.  Do not do any activities that cause pain. Ask your health care provider what activities are safe for you.  Do not lift anything that is heavier than 10 lb (4.5 kg) or the limit that your health care provider tells you.  Maintain a healthy weight. Talk with your health care provider if you need help losing weight.  If directed, apply heat to the affected area as often as told by your health care provider. Use the heat source that your health care provider recommends, such as a moist heat pack or a heating pad. ? Place a towel between your skin and the heat source. ? Leave the heat on for 20-30 minutes. ? Remove the heat if your skin turns bright red. This is especially important if you are not able to feel pain, heat, or cold. You may have a greater risk of getting burned. General instructions  Take over-the-counter and prescription medicines only as told by your health care provider.  Do not use any products that contain nicotine or tobacco, such as cigarettes and e-cigarettes. If you need help quitting, ask your health care provider.  Eat a healthy diet. This includes plenty of fruits and vegetables, whole grains, and low-fat (lean) protein.  Keep all follow-up visits as told by your health care provider. This is important. Contact   a health care provider if:  Your symptoms do not get better or they get worse.  You have a fever. Get help right away if:  You have new or worse pain in your neck or upper back.  You have severe pain that cannot be controlled with medicines.  You are dizzy.  You have vision problems, blurred vision, or double vision.  You have a severe headache that is worse when you  stand.  You have nausea or you vomit.  You develop new or worse numbness or tingling in your back or legs.  You have pain, redness, swelling, or warmth in your arm or leg. Summary  Spinal stenosis occurs when the open space (spinal canal) between the bones of your spine (vertebrae) narrows. This narrowing puts pressure on the spinal cord or nerves.  Spinal stenosis can cause numbness, weakness, or pain in the neck, back, and legs.  This condition may be caused by a birth defect, arthritic deterioration of your vertebrae, injury, tumors, or calcium deposits.  This condition is usually diagnosed with MRIs, CT scans, and X-rays. This information is not intended to replace advice given to you by your health care provider. Make sure you discuss any questions you have with your health care provider. Document Revised: 12/31/2016 Document Reviewed: 12/24/2015 Elsevier Patient Education  2020 Elsevier Inc.  

## 2019-05-14 ENCOUNTER — Ambulatory Visit (INDEPENDENT_AMBULATORY_CARE_PROVIDER_SITE_OTHER): Payer: Medicare Other | Admitting: Rehabilitative and Restorative Service Providers"

## 2019-05-14 ENCOUNTER — Encounter: Payer: Medicare Other | Admitting: Rehabilitative and Restorative Service Providers"

## 2019-05-14 ENCOUNTER — Other Ambulatory Visit: Payer: Self-pay

## 2019-05-14 DIAGNOSIS — R29898 Other symptoms and signs involving the musculoskeletal system: Secondary | ICD-10-CM

## 2019-05-14 DIAGNOSIS — M79604 Pain in right leg: Secondary | ICD-10-CM | POA: Diagnosis not present

## 2019-05-14 DIAGNOSIS — M25511 Pain in right shoulder: Secondary | ICD-10-CM

## 2019-05-14 DIAGNOSIS — M79605 Pain in left leg: Secondary | ICD-10-CM

## 2019-05-14 DIAGNOSIS — R293 Abnormal posture: Secondary | ICD-10-CM | POA: Diagnosis not present

## 2019-05-14 NOTE — Patient Instructions (Signed)
Self massage with 4 inch plastic ball    Suboccipital Stretch (Supine)    With under head. Gently tuck chin until stretch is felt at base of skull and upper neck. Hold _10___ seconds. Relax. Repeat __10__ times per set. Do __2-3__ sets per session.   Thoracic Self-Mobilization Stretch (Supine)     Lying on back - lift chest to the ceiling  Hold 10 sec x 10 reps    Snow angel -  Lying on back arms out to side resting on the surface. Hold 2-5 min - can bend elbows to release tightness as needed     Hamstring stretch sitting  Sit at edge of chair  Straighten leg out in front of you  Rest heel on the floor  Hinge forward from hips and stretch the back of the hamstring  Hold 30 sec x 3 reps

## 2019-05-14 NOTE — Therapy (Signed)
[facility] [place] [address] [place], [place], [address] Phone: [phone]   Fax:  [phone]  Physical Therapy Treatment  Patient Details  Name: Henry Matthews MRN: [id number] Date of Birth: May 30, 1934 Referring Provider (PT): Dr Benjamin Stain   Encounter Date: 05/14/2019  PT End of Session - 05/14/19 0850    Visit Number  2    Number of Visits  12    Date for PT Re-Evaluation  06/07/19    PT Start Time  0849    PT Stop Time  0925    PT Time Calculation (min)  36 min    Activity Tolerance  Patient tolerated treatment well       Past Medical History:  Diagnosis Date  . Diabetes mellitus without complication ([facility])   . Hyperlipidemia   . Hypertension     No past surgical history on file.  There were no vitals filed for this visit.  Subjective Assessment - 05/14/19 0851    Subjective  Patient reports that the leg and thigh pain is bad today. He has been working on his exercises at home. Shoulder pain is a little bit worse.    Currently in Pain?  Yes    Pain Score  5     Pain Location  Leg    Pain Orientation  Right;Left    Pain Descriptors / Indicators  Tightness    Pain Type  Acute pain    Pain Score  5    Pain Location  Shoulder    Pain Orientation  Right    Pain Descriptors / Indicators  Sharp   with movement   Pain Type  Chronic pain    Pain Onset  More than a month ago    Pain Frequency  Intermittent         [facility] PT Assessment - 05/14/19 0001      Assessment   Medical Diagnosis  Bilateral hamstring/leg pain; Rt shoulder pain     Referring Provider (PT)  Dr Benjamin Stain    Onset Date/Surgical Date  03/19/19   shoulder pain on/off for several years, worse x 1 month     Hand Dominance  Right    Next MD Visit  05/09/19    Prior Therapy  many yrs ago for hip       Precautions   Precautions  None      AROM   Right Shoulder Extension  43 Degrees    Right Shoulder Flexion  130 Degrees   discomfort    Right Shoulder ABduction  123 Degrees   discomfort pulling   Right Shoulder Internal Rotation  --   thumb T11   Right Shoulder External Rotation  59 Degrees    Left Shoulder Extension  53 Degrees    Left Shoulder Flexion  130 Degrees    Left Shoulder ABduction  128 Degrees    Left Shoulder Internal Rotation  --   thumb T10   Left Shoulder External Rotation  85 Degrees      Strength   Strength Assessment Site  --   WFL's bilat UE - pain wit hresistive Rt shd flex and ER      Palpation   Palpation comment  tightness noted through Rt shoulder girdle - pecs; upper trap; leveator; biceps; deltoid; teres as well as the hamstrings bilat                    [facility] Adult PT Treatment/Exercise - 05/14/19 0001  Therapeutic Activites    Therapeutic Activities  --   myofacial ball release bilat hamstrings/thoracic spine/pecs      Knee/Hip Exercises: Stretches   Passive Hamstring Stretch  Right;Left;2 reps;30 seconds    Passive Hamstring Stretch Limitations  supine with strap and sitting     Other Knee/Hip Stretches  sitting HS stretch heel on floor 30 sec x 3 reps each LE       Shoulder Exercises: Supine   Other Supine Exercises  prolonged snow angel 2 min x 1     Other Supine Exercises  chin tuck 10 sec x 5; chest lift 10 sec x 5       Manual Therapy   Manual therapy comments  pt supine hooklying     Soft tissue mobilization  deep tissue work through the Rt shoulder girdle - pecs; anterior deltoid; bicepsl upper trap; leveator     Myofascial Release  pecs/anterior shoulder     Passive ROM  Rt shoulder extension; extension with elbow extension; horizontal abduction              PT Education - 05/14/19 0926    Education Details  HEP    Person(s) Educated  Patient    Methods  Explanation;Demonstration;Tactile cues;Verbal cues;Handout    Comprehension  Verbalized understanding;Returned demonstration;Verbal cues required;Tactile cues required          PT  Long Term Goals - 05/14/19 1016      PT LONG TERM GOAL #1   Title  Improve standing posture with patient demonstrating more upright posture    Time  6    Period  Weeks    Status  On-going    Target Date  06/07/19      PT LONG TERM GOAL #2   Title  Increase hamstring flexibility to ~ 70 deg bilat    Time  6    Period  Weeks    Status  On-going    Target Date  06/07/19      PT LONG TERM GOAL #3   Title  Patient to report ability to stand and walk for functional periods of time without pain or limitations    Time  6    Period  Weeks    Status  On-going    Target Date  06/07/19      PT LONG TERM GOAL #4   Title  Independent in HEP    Time  6    Period  Weeks    Status  On-going    Target Date  06/07/19      PT LONG TERM GOAL #5   Title  Improve FOTO to </= 33% limitation    Time  6    Period  Weeks    Status  On-going    Target Date  06/07/19      Additional Long Term Goals   Additional Long Term Goals  Yes      PT LONG TERM GOAL #6   Title  Increase AROM Rt shoulder to equal or greater thatn AROM Lt shoulder    Time  6    Period  Weeks    Status  New    Target Date  06/07/19      PT LONG TERM GOAL #7   Title  Decrease pain Rt shoulder by 50-75% allowing patient to use Rt UE for all functional activities    Time  6    Period  Weeks    Status  New  Target Date  06/07/19            Plan - 05/14/19 7353    Clinical Impression Statement  Evaluation of shoulder demonstrates increased pain in anterior shoulder and pain with AROM Rt shoulder flexion, scaption; IR. He has palpable tightness through the pecs; anterior deltoid; bicepsl upper trap; leveator; teres. Patient responded well to manual work and exercise today. Great response to ball release work through the hamstrings and anterior shoulder. Will benefit form continued rehab to address problems identified and reach maximum rehab potential.    Rehab Potential  Good    PT Frequency  2x / week    PT  Duration  6 weeks    PT Treatment/Interventions  ADLs/Self Care Home Management;Patient/family education;Cryotherapy;Electrical Stimulation;Iontophoresis 4mg /ml Dexamethasone;Moist Heat;Ultrasound;Gait training;Stair training;Functional mobility training;Therapeutic activities;Therapeutic exercise;Balance training;Neuromuscular re-education;Manual techniques;Passive range of motion;Dry needling;Taping    PT Next Visit Plan  review and progress hamstring stretching; continue myofacial ball relesae work for hamstrings; add shoulder exercises - stretch pecs and biceps/strengthening posterior shoulder girdle; manual work and modalities as indicated    PT Home Exercise Plan  VHI    Consulted and Agree with Plan of Care  Patient       Patient will benefit from skilled therapeutic intervention in order to improve the following deficits and impairments:     Visit Diagnosis: Pain in left leg  Pain in right leg  Other symptoms and signs involving the musculoskeletal system  Acute pain of right shoulder  Abnormal posture     Problem List Patient Active Problem List   Diagnosis Date Noted  . Lumbar spinal stenosis 04/23/2019  . Impingement syndrome, shoulder, right 02/19/2015    Glorianna Gott Nilda Simmer PT, MPH  05/14/2019, 10:20 AM  [facility] [address] [place], [place], [address] Phone: [phone]   Fax:  [phone]  Name: Henry Matthews MRN: [id number] Date of Birth: 1934-06-05

## 2019-05-16 ENCOUNTER — Other Ambulatory Visit: Payer: Self-pay | Admitting: Sports Medicine

## 2019-05-16 ENCOUNTER — Encounter: Payer: Medicare Other | Admitting: Physical Therapy

## 2019-05-16 ENCOUNTER — Other Ambulatory Visit: Payer: Self-pay

## 2019-05-16 ENCOUNTER — Encounter: Payer: Self-pay | Admitting: Rehabilitative and Restorative Service Providers"

## 2019-05-16 ENCOUNTER — Ambulatory Visit (INDEPENDENT_AMBULATORY_CARE_PROVIDER_SITE_OTHER): Payer: Medicare Other | Admitting: Rehabilitative and Restorative Service Providers"

## 2019-05-16 DIAGNOSIS — M79605 Pain in left leg: Secondary | ICD-10-CM | POA: Diagnosis present

## 2019-05-16 DIAGNOSIS — R29898 Other symptoms and signs involving the musculoskeletal system: Secondary | ICD-10-CM

## 2019-05-16 DIAGNOSIS — R293 Abnormal posture: Secondary | ICD-10-CM | POA: Diagnosis not present

## 2019-05-16 DIAGNOSIS — M25511 Pain in right shoulder: Secondary | ICD-10-CM | POA: Diagnosis not present

## 2019-05-16 DIAGNOSIS — R42 Dizziness and giddiness: Secondary | ICD-10-CM | POA: Insufficient documentation

## 2019-05-16 DIAGNOSIS — M79604 Pain in right leg: Secondary | ICD-10-CM

## 2019-05-16 NOTE — Therapy (Signed)
[facility] [address] [place], [place], [address] Phone: [phone]   Fax:  [phone]  Physical Therapy Treatment  Patient Details  Name: Henry Matthews MRN: [id number] Date of Birth: Jan 31, 1935 Referring Provider (PT): Dr Benjamin Stain   Encounter Date: 05/16/2019  PT End of Session - 05/16/19 0854    Visit Number  3    Number of Visits  12    Date for PT Re-Evaluation  06/07/19    PT Start Time  0847    PT Stop Time  0920    PT Time Calculation (min)  33 min    Activity Tolerance  Patient tolerated treatment well       Past Medical History:  Diagnosis Date  . Diabetes mellitus without complication (HCC)   . Hyperlipidemia   . Hypertension     History reviewed. No pertinent surgical history.  There were no vitals filed for this visit.  Subjective Assessment - 05/16/19 0855    Subjective  Shoulder is feeling better following last PT visit. Patient is having pain in the bottom of the buttocks this morning - had been feeling better. Working on the ball release work - trying to find the right ball the one he has is too hard. Patient reports that he has been having dizziness when he moves from lying to sit in the past 1-2 days. He has had BPPV in the past and was given some exercises but has not started those. He is interested in seeingvestibular PT for evaluation and treatment of BPPV.    Currently in Pain?  Yes    Pain Score  4     Pain Location  Leg    Pain Orientation  Right;Left    Pain Descriptors / Indicators  Tightness    Pain Type  Acute pain    Pain Onset  More than a month ago    Pain Frequency  Intermittent    Pain Score  0    Pain Location  Shoulder    Pain Orientation  Right                       [facility] Adult PT Treatment/Exercise - 05/16/19 0001      Therapeutic Activites    Therapeutic Activities  --   myofacial ball release bilat hamstrings/thoracic spine/pecs      Neuro  Re-ed    Neuro Re-ed Details   working on posture and alignment focus on chest lift       Knee/Hip Exercises: Stretches   Passive Hamstring Stretch  Right;Left;2 reps;30 seconds    Passive Hamstring Stretch Limitations  supine with strap and sitting - added HS stretch with LE in slight abduction and adduction     Other Knee/Hip Stretches  sitting HS stretch heel on floor 30 sec x 3 reps each LE       Shoulder Exercises: Standing   Other Standing Exercises  shoulder rolls posterior     Other Standing Exercises  scap squeeze with noodle 10 sec x 5 reps       Shoulder Exercises: Stretch   Other Shoulder Stretches  doorway stretch 30 sec x 2 each positions NO PAIN       Manual Therapy   Manual therapy comments  pt sitting     Soft tissue mobilization  deep tissue work through the Rt shoulder girdle - pecs; anterior deltoid; biceps; upper trap; leveator     Passive ROM  thoracic  extension sitting with noodle along spine PT assist              PT Education - 05/16/19 0905    Education Details  HEP    Person(s) Educated  Patient    Methods  Explanation;Demonstration;Tactile cues;Verbal cues;Handout    Comprehension  Verbalized understanding;Returned demonstration;Verbal cues required;Tactile cues required          PT Long Term Goals - 05/14/19 1016      PT LONG TERM GOAL #1   Title  Improve standing posture with patient demonstrating more upright posture    Time  6    Period  Weeks    Status  On-going    Target Date  06/07/19      PT LONG TERM GOAL #2   Title  Increase hamstring flexibility to ~ 70 deg bilat    Time  6    Period  Weeks    Status  On-going    Target Date  06/07/19      PT LONG TERM GOAL #3   Title  Patient to report ability to stand and walk for functional periods of time without pain or limitations    Time  6    Period  Weeks    Status  On-going    Target Date  06/07/19      PT LONG TERM GOAL #4   Title  Independent in HEP    Time  6     Period  Weeks    Status  On-going    Target Date  06/07/19      PT LONG TERM GOAL #5   Title  Improve FOTO to </= 33% limitation    Time  6    Period  Weeks    Status  On-going    Target Date  06/07/19      Additional Long Term Goals   Additional Long Term Goals  Yes      PT LONG TERM GOAL #6   Title  Increase AROM Rt shoulder to equal or greater thatn AROM Lt shoulder    Time  6    Period  Weeks    Status  New    Target Date  06/07/19      PT LONG TERM GOAL #7   Title  Decrease pain Rt shoulder by 50-75% allowing patient to use Rt UE for all functional activities    Time  6    Period  Weeks    Status  New    Target Date  06/07/19            Plan - 05/16/19 0900    Clinical Impression Statement  Good improvement overall with decreased pain and improving HS mobility. Added exercises for shoulder. Requested approval for evaluation and treatment of dizziness.    PT Frequency  2x / week    PT Duration  6 weeks    PT Treatment/Interventions  ADLs/Self Care Home Management;Patient/family education;Cryotherapy;Electrical Stimulation;Iontophoresis 4mg /ml Dexamethasone;Moist Heat;Ultrasound;Gait training;Stair training;Functional mobility training;Therapeutic activities;Therapeutic exercise;Balance training;Neuromuscular re-education;Manual techniques;Passive range of motion;Dry needling;Taping    PT Next Visit Plan  review and progress hamstring stretching; continue myofacial ball relesae work for hamstrings; add shoulder exercises - stretch biceps/strengthening posterior shoulder girdle; manual work and modalities as indicated - assess dizziness as approved by MD    PT Home Exercise Plan  VHI    Consulted and Agree with Plan of Care  Patient       Patient will benefit from  skilled therapeutic intervention in order to improve the following deficits and impairments:     Visit Diagnosis: Pain in left leg  Pain in right leg  Other symptoms and signs involving the  musculoskeletal system  Acute pain of right shoulder  Abnormal posture     Problem List Patient Active Problem List   Diagnosis Date Noted  . Vertigo 05/16/2019  . Lumbar spinal stenosis 04/23/2019  . Impingement syndrome, shoulder, right 02/19/2015    Laekyn Rayos Rober Minion PT, MPH 05/16/2019, 9:28 AM  [facility] [address] [place], [place], [address] Phone: [phone]   Fax:  [phone]  Name: Henry Matthews MRN: [id number] Date of Birth: 1934/07/06

## 2019-05-16 NOTE — Assessment & Plan Note (Signed)
Notified by physical therapy that Henry Matthews was having symptoms of BPPV. They have asked me to place an order for vestibular rehabilitation, I am happy to place this order but patient understands that any evaluation and follow-up of this needs to be with his PCP.

## 2019-05-16 NOTE — Patient Instructions (Signed)
  NO PAIN - just a gentle stretch   Scapula Adduction With Pectorals, Low   Stand in doorframe with palms against frame and arms at 45. Lean forward and squeeze shoulder blades. Hold ___ seconds. Repeat ___ times per session. Do ___ sessions per day.   Scapula Adduction With Pectorals, Mid-Range   Stand in doorframe with palms against frame and arms at 90. Lean forward and squeeze shoulder blades. Hold ___ seconds. Repeat ___ times per session. Do ___ sessions per day. \Scapula Adduction With Pectorals, High   Stand in doorframe with palms against frame and arms at 120. Lean forward and squeeze shoulder blades. Hold ___ seconds. Repeat ___ times per session. Do ___ sessions per day.   Shoulder Blade Squeeze    Rotate shoulders back, then squeeze shoulder blades down and back. Hold 10 sec Repeat __10__ times. Do __several__ sessions per day.

## 2019-05-17 ENCOUNTER — Ambulatory Visit: Payer: Medicare Other | Admitting: Rehabilitative and Restorative Service Providers"

## 2019-05-24 ENCOUNTER — Ambulatory Visit (INDEPENDENT_AMBULATORY_CARE_PROVIDER_SITE_OTHER): Payer: Medicare Other | Admitting: Rehabilitative and Restorative Service Providers"

## 2019-05-24 ENCOUNTER — Encounter: Payer: Self-pay | Admitting: Rehabilitative and Restorative Service Providers"

## 2019-05-24 ENCOUNTER — Other Ambulatory Visit: Payer: Self-pay

## 2019-05-24 DIAGNOSIS — M79605 Pain in left leg: Secondary | ICD-10-CM | POA: Diagnosis present

## 2019-05-24 DIAGNOSIS — R293 Abnormal posture: Secondary | ICD-10-CM | POA: Diagnosis not present

## 2019-05-24 DIAGNOSIS — M79604 Pain in right leg: Secondary | ICD-10-CM | POA: Diagnosis not present

## 2019-05-24 DIAGNOSIS — M25511 Pain in right shoulder: Secondary | ICD-10-CM

## 2019-05-24 DIAGNOSIS — R29898 Other symptoms and signs involving the musculoskeletal system: Secondary | ICD-10-CM

## 2019-05-24 NOTE — Therapy (Signed)
[facility] [address] [place], [place], [address] Phone: [phone]   Fax:  [phone]  Physical Therapy Treatment  Patient Details  Name: Henry Matthews MRN: [id number] Date of Birth: 1934/02/11 Referring Provider (PT): Dr Benjamin Stain   Encounter Date: 05/24/2019  PT End of Session - 05/24/19 0851    Visit Number  4    Number of Visits  12    Date for PT Re-Evaluation  06/07/19    PT Start Time  0849    PT Stop Time  0924    PT Time Calculation (min)  35 min       Past Medical History:  Diagnosis Date  . Diabetes mellitus without complication (HCC)   . Hyperlipidemia   . Hypertension     History reviewed. No pertinent surgical history.  There were no vitals filed for this visit.  Subjective Assessment - 05/24/19 0852    Subjective  Dizziness is resolbved - it was due to medication which MD changed. Shoulder is 90% better. legs just hurt up high at the buttocks on both sides.    Currently in Pain?  Yes    Pain Score  7     Pain Location  Buttocks    Pain Orientation  Right;Left    Pain Descriptors / Indicators  Tightness    Pain Score  2    Pain Location  Shoulder    Pain Orientation  Right    Pain Descriptors / Indicators  Dull    Pain Type  Chronic pain    Pain Onset  More than a month ago    Pain Frequency  Intermittent         [facility] PT Assessment - 05/24/19 0001      Assessment   Medical Diagnosis  Bilateral hamstring/leg pain; Rt shoulder pain     Referring Provider (PT)  Dr Benjamin Stain    Onset Date/Surgical Date  03/19/19   shoulder pain on/off for several years, worse x 1 month     Hand Dominance  Right    Next MD Visit  05/09/19    Prior Therapy  many yrs ago for hip       AROM   Right Shoulder Extension  55 Degrees    Right Shoulder Flexion  141 Degrees    Right Shoulder ABduction  145 Degrees    Right Shoulder Internal Rotation  --   thumb to T10   Right Shoulder External  Rotation  76 Degrees      Palpation   Palpation comment  tightness noted through Rt shoulder girdle - pecs; upper trap; leveator; biceps; deltoid; teres as well as the hamstrings bilat at ischeal tuberosity                    [facility] Adult PT Treatment/Exercise - 05/24/19 0001      Knee/Hip Exercises: Stretches   Passive Hamstring Stretch  Right;Left;2 reps;30 seconds    Passive Hamstring Stretch Limitations  supine with strap and sitting - added HS stretch with LE in slight abduction and adduction     Other Knee/Hip Stretches  sitting HS stretch heel on floor 30 sec x 3 reps each LE       Shoulder Exercises: Stretch   Other Shoulder Stretches  doorway stretch 30 sec x 2 each positions NO PAIN    added slight turn to Lt to increase stretch Rt      Manual Therapy   Manual therapy  comments  pt prone     Soft tissue mobilization  deep tissue work through the ischeal tuberosity proximal HS bilat              PT Education - 05/24/19 0918    Education Details  myofacial release with ball/ massage block bilat ischeal tuberosity    Person(s) Educated  Patient    Methods  Explanation;Demonstration;Tactile cues;Verbal cues;Handout    Comprehension  Verbalized understanding;Returned demonstration;Verbal cues required;Tactile cues required          PT Long Term Goals - 05/14/19 1016      PT LONG TERM GOAL #1   Title  Improve standing posture with patient demonstrating more upright posture    Time  6    Period  Weeks    Status  On-going    Target Date  06/07/19      PT LONG TERM GOAL #2   Title  Increase hamstring flexibility to ~ 70 deg bilat    Time  6    Period  Weeks    Status  On-going    Target Date  06/07/19      PT LONG TERM GOAL #3   Title  Patient to report ability to stand and walk for functional periods of time without pain or limitations    Time  6    Period  Weeks    Status  On-going    Target Date  06/07/19      PT LONG TERM GOAL #4   Title   Independent in HEP    Time  6    Period  Weeks    Status  On-going    Target Date  06/07/19      PT LONG TERM GOAL #5   Title  Improve FOTO to </= 33% limitation    Time  6    Period  Weeks    Status  On-going    Target Date  06/07/19      Additional Long Term Goals   Additional Long Term Goals  Yes      PT LONG TERM GOAL #6   Title  Increase AROM Rt shoulder to equal or greater thatn AROM Lt shoulder    Time  6    Period  Weeks    Status  New    Target Date  06/07/19      PT LONG TERM GOAL #7   Title  Decrease pain Rt shoulder by 50-75% allowing patient to use Rt UE for all functional activities    Time  6    Period  Weeks    Status  New    Target Date  06/07/19            Plan - 05/24/19 0919    Clinical Impression Statement  Excellent progress with shoulder rehab. Bilat LE's continue to improve. posterior thigh is no longer painful - pt has continued pain at origin of hamstrings Rt > Lt - may benefit fro mtrial of DN    Rehab Potential  Good    PT Frequency  2x / week    PT Duration  6 weeks    PT Treatment/Interventions  ADLs/Self Care Home Management;Patient/family education;Cryotherapy;Electrical Stimulation;Iontophoresis 4mg /ml Dexamethasone;Moist Heat;Ultrasound;Gait training;Stair training;Functional mobility training;Therapeutic activities;Therapeutic exercise;Balance training;Neuromuscular re-education;Manual techniques;Passive range of motion;Dry needling;Taping    PT Next Visit Plan  hamstring stretching add lat/med HS stretch; continue myofacial ball relesae work for hamstrings; add shoulder exercises - stretch biceps/strengthening posterior shoulder girdle; manual  work and modalities as indicated    PT Home Exercise Plan  VHI    Consulted and Agree with Plan of Care  Patient       Patient will benefit from skilled therapeutic intervention in order to improve the following deficits and impairments:     Visit Diagnosis: Pain in left leg  Pain in  right leg  Other symptoms and signs involving the musculoskeletal system  Acute pain of right shoulder  Abnormal posture     Problem List Patient Active Problem List   Diagnosis Date Noted  . Vertigo 05/16/2019  . Lumbar spinal stenosis 04/23/2019  . Impingement syndrome, shoulder, right 02/19/2015    Kerrigan Glendening Nilda Simmer PT, MPH 05/24/2019, 9:24 AM  [facility] [address] [place], [place], [address] Phone: [phone]   Fax:  [phone]  Name: Pasqual Farias MRN: [id number] Date of Birth: 1935-01-20

## 2019-05-25 ENCOUNTER — Encounter: Payer: Self-pay | Admitting: Rehabilitative and Restorative Service Providers"

## 2019-05-25 ENCOUNTER — Ambulatory Visit (INDEPENDENT_AMBULATORY_CARE_PROVIDER_SITE_OTHER): Payer: Medicare Other | Admitting: Rehabilitative and Restorative Service Providers"

## 2019-05-25 DIAGNOSIS — R29898 Other symptoms and signs involving the musculoskeletal system: Secondary | ICD-10-CM | POA: Diagnosis not present

## 2019-05-25 DIAGNOSIS — M79605 Pain in left leg: Secondary | ICD-10-CM | POA: Diagnosis present

## 2019-05-25 DIAGNOSIS — R293 Abnormal posture: Secondary | ICD-10-CM

## 2019-05-25 DIAGNOSIS — M79604 Pain in right leg: Secondary | ICD-10-CM

## 2019-05-25 DIAGNOSIS — M25511 Pain in right shoulder: Secondary | ICD-10-CM | POA: Diagnosis not present

## 2019-05-25 NOTE — Patient Instructions (Addendum)
Resisted External Rotation: in Neutral - Bilateral   PALMS UP Sit or stand, tubing in both hands, elbows at sides, bent to 90, forearms forward. Pinch shoulder blades together and rotate forearms out. Keep elbows at sides. Repeat __10__ times per set. Do _2-3___ sets per session. Do _2-3___ sessions per day.   Low Row: Standing   Face anchor, feet shoulder width apart. Palms up, pull arms back, squeezing shoulder blades together. Repeat 10__ times per set. Do 2-3__ sets per session. Do 2-3__ sessions per week. Anchor Height: Waist     Strengthening: Resisted Extension   Hold tubing in right hand, arm forward. Pull arm back, elbow straight. Repeat _10___ times per set. Do 2-3____ sets per session. Do 2-3____ sessions per day.    Outer Hip Stretch: Reclined IT Band Stretch (Strap)    Strap around opposite foot, pull across only as far as possible with shoulders on mat. Hold for __30 sec  Repeat _3 ___ times each leg. 2-3 times per day  Also stretch the hamstrings with bringing your leg out to the side some  Hold 30 sec repeat 3 times 2-3 times/day

## 2019-05-25 NOTE — Therapy (Signed)
[facility] [address] [place], [place], [address] Phone: [phone]   Fax:  [phone]  Physical Therapy Treatment  Patient Details  Name: Henry Matthews MRN: [id number] Date of Birth: Sep 04, 1934 Referring Provider (PT): Dr Benjamin Stain   Encounter Date: 05/25/2019  PT End of Session - 05/25/19 0848    Visit Number  5    Number of Visits  12    Date for PT Re-Evaluation  06/07/19    PT Start Time  0848    PT Stop Time  0928    PT Time Calculation (min)  40 min    Activity Tolerance  Patient tolerated treatment well       Past Medical History:  Diagnosis Date  . Diabetes mellitus without complication (HCC)   . Hyperlipidemia   . Hypertension     History reviewed. No pertinent surgical history.  There were no vitals filed for this visit.  Subjective Assessment - 05/25/19 0851    Subjective  Shoulder is doing great. buttocks are better today than yesterday. Does not want to try to DN. Did well with the deep tissue work yesterday. Trying to find a ball that works.    Currently in Pain?  Yes    Pain Score  7     Pain Orientation  Right;Left    Pain Descriptors / Indicators  Tightness    Pain Type  Acute pain    Pain Score  0    Pain Location  Shoulder    Pain Orientation  Right    Pain Descriptors / Indicators  Dull    Pain Type  Chronic pain                       [facility] Adult PT Treatment/Exercise - 05/25/19 0001      Therapeutic Activites    Therapeutic Activities  --   myofacial ball release bilat ischeal tuberosities      Knee/Hip Exercises: Stretches   Passive Hamstring Stretch  Right;Left;2 reps;30 seconds    Passive Hamstring Stretch Limitations  supine with strap and sitting - added HS stretch with LE in slight abduction and adduction    added HS stretch-abducted & adducted positions-reps above   Other Knee/Hip Stretches  sitting HS stretch heel on floor 30 sec x 3 reps each LE        Shoulder Exercises: Standing   Extension  Strengthening;Both;10 reps;Theraband    Theraband Level (Shoulder Extension)  Level 2 (Red)    Row  Strengthening;Both;10 reps;Theraband    Theraband Level (Shoulder Row)  Level 2 (Red)    Retraction  Strengthening;Both;10 reps;Theraband   bilat ER w/ noodle    Theraband Level (Shoulder Retraction)  Level 1 (Yellow)    Other Standing Exercises  shoulder rolls posterior     Other Standing Exercises  scap squeeze with noodle 10 sec x 5 reps       Shoulder Exercises: Stretch   Other Shoulder Stretches  doorway stretch 30 sec x 2 each positions NO PAIN    added slight turn to Lt to increase stretch Rt      Manual Therapy   Manual therapy comments  pt prone     Soft tissue mobilization  deep tissue work through the ischeal tuberosity proximal HS bilat              PT Education - 05/25/19 0900    Education Details  HEP    Person(s) Educated  Patient    Methods  Explanation;Demonstration;Tactile cues;Verbal cues;Handout    Comprehension  Verbalized understanding;Returned demonstration;Verbal cues required;Tactile cues required          PT Long Term Goals - 05/14/19 1016      PT LONG TERM GOAL #1   Title  Improve standing posture with patient demonstrating more upright posture    Time  6    Period  Weeks    Status  On-going    Target Date  06/07/19      PT LONG TERM GOAL #2   Title  Increase hamstring flexibility to ~ 70 deg bilat    Time  6    Period  Weeks    Status  On-going    Target Date  06/07/19      PT LONG TERM GOAL #3   Title  Patient to report ability to stand and walk for functional periods of time without pain or limitations    Time  6    Period  Weeks    Status  On-going    Target Date  06/07/19      PT LONG TERM GOAL #4   Title  Independent in HEP    Time  6    Period  Weeks    Status  On-going    Target Date  06/07/19      PT LONG TERM GOAL #5   Title  Improve FOTO to </= 33% limitation     Time  6    Period  Weeks    Status  On-going    Target Date  06/07/19      Additional Long Term Goals   Additional Long Term Goals  Yes      PT LONG TERM GOAL #6   Title  Increase AROM Rt shoulder to equal or greater thatn AROM Lt shoulder    Time  6    Period  Weeks    Status  New    Target Date  06/07/19      PT LONG TERM GOAL #7   Title  Decrease pain Rt shoulder by 50-75% allowing patient to use Rt UE for all functional activities    Time  6    Period  Weeks    Status  New    Target Date  06/07/19            Plan - 05/25/19 0853    Clinical Impression Statement  Shoulder is doing well with patient reporting no pain in the Rt shoulder. He is pleased with the progress in bilat hamstrings at ischeal tuberosity but patient does not want to try the dry needling. He has responded well to deep tissue work and stretching - will continue with current plan.    Rehab Potential  Good    PT Frequency  2x / week    PT Duration  6 weeks    PT Treatment/Interventions  ADLs/Self Care Home Management;Patient/family education;Cryotherapy;Electrical Stimulation;Iontophoresis 4mg /ml Dexamethasone;Moist Heat;Ultrasound;Gait training;Stair training;Functional mobility training;Therapeutic activities;Therapeutic exercise;Balance training;Neuromuscular re-education;Manual techniques;Passive range of motion;Dry needling;Taping    PT Next Visit Plan  hamstring stretching add lat/med HS stretch; continue myofacial ball relesae work for hamstrings; add shoulder exercises - stretch biceps/strengthening posterior shoulder girdle; manual work and modalities as indicated    PT Home Exercise Plan  VHI    Consulted and Agree with Plan of Care  Patient       Patient will benefit from skilled therapeutic intervention in order to improve the following  deficits and impairments:     Visit Diagnosis: Pain in left leg  Pain in right leg  Other symptoms and signs involving the musculoskeletal  system  Acute pain of right shoulder  Abnormal posture     Problem List Patient Active Problem List   Diagnosis Date Noted  . Vertigo 05/16/2019  . Lumbar spinal stenosis 04/23/2019  . Impingement syndrome, shoulder, right 02/19/2015    Serenity Batley Nilda Simmer PT, MPH  05/25/2019, 9:12 AM  [facility] [place], [place], [address] Phone: [phone]   Fax:  [phone]  Name: Duan Scharnhorst MRN: [id number] Date of Birth: May 25, 1934

## 2019-05-30 ENCOUNTER — Other Ambulatory Visit: Payer: Self-pay

## 2019-05-30 ENCOUNTER — Ambulatory Visit (INDEPENDENT_AMBULATORY_CARE_PROVIDER_SITE_OTHER): Payer: Medicare Other | Admitting: Rehabilitative and Restorative Service Providers"

## 2019-05-30 ENCOUNTER — Encounter: Payer: Self-pay | Admitting: Rehabilitative and Restorative Service Providers"

## 2019-05-30 DIAGNOSIS — M79604 Pain in right leg: Secondary | ICD-10-CM

## 2019-05-30 DIAGNOSIS — R293 Abnormal posture: Secondary | ICD-10-CM

## 2019-05-30 DIAGNOSIS — R29898 Other symptoms and signs involving the musculoskeletal system: Secondary | ICD-10-CM

## 2019-05-30 DIAGNOSIS — M79605 Pain in left leg: Secondary | ICD-10-CM

## 2019-05-30 DIAGNOSIS — M25511 Pain in right shoulder: Secondary | ICD-10-CM | POA: Diagnosis not present

## 2019-05-30 NOTE — Patient Instructions (Signed)
Functional Quadriceps: Sit to Stand    Sit on edge of chair, feet flat on floor. Stand upright, extending knees fully. Repeat __10__ times per set. Do _1-2___ sets per session. Do __1-2__ sessions per day.  Strengthening: Wall Slide    Leaning on wall, slowly lower buttocks until thighs are parallel to floor. Hold __5-10__ seconds. Tighten thigh muscles and return. Repeat __10__ times per set. Do 1-2____ sets per session. Do _1-2___ sessions per day.   Back Leg Kick    Bring leg back keeping knee straight. Return to center. Repeat with other leg. Repeat __10-20__ times. Do __1-2 __ sessions per day.   Hip Abduction: Standing Side Leg Lift     Lift leg out to side hold 2-3 sec Slowly lower for 3-5 seconds. Hold support if needed. _10__ reps per set, _1-2__ sets per day, _1-2__ days per week.  lead out with heel - toes pointing in   Quadriceps Set (Prone)    With toes supporting lower legs, tighten thigh muscles to straighten knees. Hold __10__ seconds. Relax. Repeat __10__ times per set. Do __1-2__ sets per session. Do __1__ sessions per day.  SINGLE LIMB STANCE    Stance: single leg on floor. Raise leg. Hold _20-30__ seconds. Repeat with other leg. __5_ reps per set, _1-2__ sets per day Can touch with hand to help with balance as needed

## 2019-05-30 NOTE — Therapy (Signed)
[facility] [place] [address] [place], [place], [address] Phone: [phone]   Fax:  [phone]  Physical Therapy Treatment  Patient Details  Name: Henry Matthews MRN: [id number] Date of Birth: December 22, 1934 Referring Provider (PT): Dr Benjamin Stain   Encounter Date: 05/30/2019  PT End of Session - 05/30/19 0853    Visit Number  6    Number of Visits  12    Date for PT Re-Evaluation  06/07/19    PT Start Time  0840    PT Stop Time  0922    PT Time Calculation (min)  42 min    Activity Tolerance  Patient tolerated treatment well       Past Medical History:  Diagnosis Date  . Diabetes mellitus without complication (HCC)   . Hyperlipidemia   . Hypertension     History reviewed. No pertinent surgical history.  There were no vitals filed for this visit.  Subjective Assessment - 05/30/19 0854    Subjective  Shoulder is 100% - legs are improving. Feels some discomfort in the mornings and when he sits too long    Currently in Pain?  Yes    Pain Score  4     Pain Location  Buttocks    Pain Orientation  Right;Left    Pain Score  0    Pain Location  Shoulder    Pain Orientation  Right                       [facility] Adult PT Treatment/Exercise - 05/30/19 0001      Knee/Hip Exercises: Stretches   Passive Hamstring Stretch  Right;Left;2 reps;30 seconds    Passive Hamstring Stretch Limitations  supine with strap and sitting - added HS stretch with LE in slight abduction and adduction    added HS stretch-abducted & adducted positions-reps above   Other Knee/Hip Stretches  sitting HS stretch heel on floor 30 sec x 3 reps each LE       Knee/Hip Exercises: Aerobic   Nustep  L5 x 8 min       Knee/Hip Exercises: Standing   Hip ADduction  AROM;Strengthening;Right;Left;10 reps    Hip Extension  AROM;Stengthening;Right;Left;10 reps;Knee straight    Wall Squat  10 reps;5 seconds    SLS  20-30 sec x 3 reps each LE UE  assist as needed       Knee/Hip Exercises: Seated   Sit to Sand  10 reps;without UE support      Knee/Hip Exercises: Prone   Other Prone Exercises  quad set toe resting on surface 10 sec hold x 10 each LE       Shoulder Exercises: Stretch   Other Shoulder Stretches  doorway stretch 30 sec x 2 each positions NO PAIN    added slight turn to Lt to increase stretch Rt      Manual Therapy   Manual therapy comments  pt prone     Soft tissue mobilization  deep tissue work through the ischeal tuberosity proximal to mid HS bilat              PT Education - 05/30/19 0917    Education Details  HEP    Person(s) Educated  Patient    Methods  Explanation;Demonstration;Tactile cues;Verbal cues;Handout    Comprehension  Verbalized understanding;Returned demonstration;Verbal cues required;Tactile cues required          PT Long Term Goals - 05/14/19 1016  PT LONG TERM GOAL #1   Title  Improve standing posture with patient demonstrating more upright posture    Time  6    Period  Weeks    Status  On-going    Target Date  06/07/19      PT LONG TERM GOAL #2   Title  Increase hamstring flexibility to ~ 70 deg bilat    Time  6    Period  Weeks    Status  On-going    Target Date  06/07/19      PT LONG TERM GOAL #3   Title  Patient to report ability to stand and walk for functional periods of time without pain or limitations    Time  6    Period  Weeks    Status  On-going    Target Date  06/07/19      PT LONG TERM GOAL #4   Title  Independent in HEP    Time  6    Period  Weeks    Status  On-going    Target Date  06/07/19      PT LONG TERM GOAL #5   Title  Improve FOTO to </= 33% limitation    Time  6    Period  Weeks    Status  On-going    Target Date  06/07/19      Additional Long Term Goals   Additional Long Term Goals  Yes      PT LONG TERM GOAL #6   Title  Increase AROM Rt shoulder to equal or greater thatn AROM Lt shoulder    Time  6    Period  Weeks     Status  New    Target Date  06/07/19      PT LONG TERM GOAL #7   Title  Decrease pain Rt shoulder by 50-75% allowing patient to use Rt UE for all functional activities    Time  6    Period  Weeks    Status  New    Target Date  06/07/19            Plan - 05/30/19 0906    Clinical Impression Statement  Shoulder is 100% per pt report. he is working on his HEP. LE's continue to improve. Added LE exercises without difficulty. Progressing well toward stated goals of therapy.    Rehab Potential  Good    PT Frequency  2x / week    PT Duration  6 weeks    PT Treatment/Interventions  ADLs/Self Care Home Management;Patient/family education;Cryotherapy;Electrical Stimulation;Iontophoresis 4mg /ml Dexamethasone;Moist Heat;Ultrasound;Gait training;Stair training;Functional mobility training;Therapeutic activities;Therapeutic exercise;Balance training;Neuromuscular re-education;Manual techniques;Passive range of motion;Dry needling;Taping    PT Next Visit Plan  hamstring stretching continue lat/med HS stretch; continue myofacial ball relesae work for hamstrings; continue shoulder exercises - stretch biceps/strengthening posterior shoulder girdle; manual work and modalities as indicated    PT Home Exercise Plan  VHI    Consulted and Agree with Plan of Care  Patient       Patient will benefit from skilled therapeutic intervention in order to improve the following deficits and impairments:     Visit Diagnosis: Pain in left leg  Pain in right leg  Other symptoms and signs involving the musculoskeletal system  Acute pain of right shoulder  Abnormal posture     Problem List Patient Active Problem List   Diagnosis Date Noted  . Vertigo 05/16/2019  . Lumbar spinal stenosis 04/23/2019  . Impingement syndrome,  shoulder, right 02/19/2015    Henry Matthews Nilda Simmer PT, MPH  05/30/2019, 9:31 AM  [facility] [place] [address] [place], [place],  [address] Phone: [phone]   Fax:  [phone]  Name: Henry Matthews MRN: [id number] Date of Birth: 12/09/1934

## 2019-06-01 ENCOUNTER — Encounter: Payer: Medicare Other | Admitting: Physical Therapy

## 2019-06-05 ENCOUNTER — Encounter: Payer: Self-pay | Admitting: Physical Therapy

## 2019-06-05 ENCOUNTER — Other Ambulatory Visit: Payer: Self-pay

## 2019-06-05 ENCOUNTER — Ambulatory Visit (INDEPENDENT_AMBULATORY_CARE_PROVIDER_SITE_OTHER): Payer: Medicare Other | Admitting: Physical Therapy

## 2019-06-05 DIAGNOSIS — R29898 Other symptoms and signs involving the musculoskeletal system: Secondary | ICD-10-CM | POA: Diagnosis not present

## 2019-06-05 DIAGNOSIS — M79605 Pain in left leg: Secondary | ICD-10-CM

## 2019-06-05 DIAGNOSIS — M79604 Pain in right leg: Secondary | ICD-10-CM | POA: Diagnosis not present

## 2019-06-05 DIAGNOSIS — M25511 Pain in right shoulder: Secondary | ICD-10-CM | POA: Diagnosis not present

## 2019-06-05 NOTE — Therapy (Signed)
[place], [place], [address] Phone: [phone]   Fax:  [phone]  Physical Therapy Treatment  Patient Details  Name: Henry Matthews MRN: [id number] Date of Birth: Nov 14, 1934 Referring Provider (PT): Dr Dianah Field   Encounter Date: 06/05/2019  PT End of Session - 06/05/19 0845    Visit Number  7    Number of Visits  12    Date for PT Re-Evaluation  06/07/19    PT Start Time  0845    PT Stop Time  0926    PT Time Calculation (min)  41 min       Past Medical History:  Diagnosis Date  . Diabetes mellitus without complication ([place])   . Hyperlipidemia   . Hypertension     History reviewed. No pertinent surgical history.  There were no vitals filed for this visit.  Subjective Assessment - 06/05/19 0845    Subjective  He reports some continued stiffness in the legs in the morning. Shoulder is ok. He feels that he may only need a few more sessions before d/c. HEP is going well, although pt stated he "gets lazy sometimes".    Pertinent History  Rt shoulder pain - has had two injections in the past with some improvement; injection in the Rt hip a few years ago; scrapel in shoulder and hip war ~ 26 yrs ago; macular degeneration; HTN; AODM    Currently in Pain?  No/denies    Pain Score  0-No pain    Pain Location  Leg    Pain Orientation  Left    Aggravating Factors   sitting and getting up from sitting    Pain Score  0    Pain Location  Shoulder    Pain Orientation  Right         [facility] PT Assessment - 06/05/19 0001      Assessment   Medical Diagnosis  Bilateral hamstring/leg pain; Rt shoulder pain     Referring Provider (PT)  Dr Dianah Field    Onset Date/Surgical Date  03/19/19   shoulder pain on/off for several years, worse x 1 month     Hand Dominance  Right    Next MD Visit  05/09/19    Prior Therapy  many yrs ago for hip       Observation/Other Assessments   Focus on Therapeutic  Outcomes (FOTO)   31% limitation         [facility] Adult PT Treatment/Exercise - 06/05/19 0001      Knee/Hip Exercises: Stretches   Passive Hamstring Stretch  Right;Left;30 seconds;3 reps   supine with strap   ITB Stretch  Right;Left;2 reps;30 seconds    Other Knee/Hip Stretches  downdog at railing, x 3 , 30 sec; ADd stretch, supine with strap, Rt, Lt, x 2, 30 sec    Other Knee/Hip Stretches  pyramid pose at railing (feet are staggered and straight-bend forward at hips) bilate UE support, x 2 each side, x 20 sec.    VC's for form     Knee/Hip Exercises: Aerobic   Nustep  L5 x 5 min     Other Aerobic  laps between exercises for recovery/cool down x 2      Knee/Hip Exercises: Standing   Hip ADduction  Strengthening;1 set;5 reps;Left    Hip Abduction  Stengthening;1 set;10 reps;Knee straight;Right;Left    Hip Extension  AROM;Stengthening;Right;Left;10 reps;Knee straight;1 set    Wall Squat  1 set;10 reps   elevator  squats (all way down, half way up, back down, all way up)    Other Standing Knee Exercises  single leg reach to counter, bilat UE support, x 10 each side   LOB x 2- pt corrected with step strategy; SBA     Knee/Hip Exercises: Prone   Other Prone Exercises  quad set toe resting on surface 10 sec hold x 10 each LE          PT Long Term Goals - 06/05/19 0848      PT LONG TERM GOAL #1   Title  Improve standing posture with patient demonstrating more upright posture    Time  6    Period  Weeks    Status  On-going      PT LONG TERM GOAL #2   Title  Increase hamstring flexibility to ~ 70 deg bilat    Baseline  71 Rt, 72 Lt (06/05/19)    Time  6    Period  Weeks    Status  Achieved      PT LONG TERM GOAL #3   Title  Patient to report ability to stand and walk for functional periods of time without pain or limitations    Baseline  per pt statement    Time  6    Period  Weeks    Status  Achieved      PT LONG TERM GOAL #4   Title  Independent in HEP    Time  6     Period  Weeks    Status  On-going      PT LONG TERM GOAL #5   Title  Improve FOTO to </= 33% limitation    Baseline  31% (06/05/19)    Time  6    Period  Weeks    Status  Achieved      PT LONG TERM GOAL #6   Title  Increase AROM Rt shoulder to equal or greater thatn AROM Lt shoulder    Baseline  Lt 150 deg, Rt 145 deg (06/05/19)    Time  6    Period  Weeks    Status  On-going      PT LONG TERM GOAL #7   Title  Decrease pain Rt shoulder by 50-75% allowing patient to use Rt UE for all functional activities    Baseline  75% per pt statement    Time  6    Period  Weeks    Status  Achieved        Plan - 06/05/19 1127    Clinical Impression Statement  He did well with new strengthening and stretching exercises today with no increases in discomfort.Pt had some decrease balance with SLS exercises on each LE. He has met most of his goals and is very close to achieving the remaining LTG's. Spoke with supervising PT about pt's anticipated d/c.    Stability/Clinical Decision Making  Stable/Uncomplicated    Rehab Potential  Good    PT Frequency  2x / week    PT Duration  6 weeks    PT Treatment/Interventions  ADLs/Self Care Home Management;Patient/family education;Cryotherapy;Electrical Stimulation;Iontophoresis 61m/ml Dexamethasone;Moist Heat;Ultrasound;Gait training;Stair training;Functional mobility training;Therapeutic activities;Therapeutic exercise;Balance training;Neuromuscular re-education;Manual techniques;Passive range of motion;Dry needling;Taping    PT Next Visit Plan  hamstring stretching continue lat/med HS stretch; continue myofacial ball relesae work for hamstrings; continue shoulder exercises - stretch biceps/strengthening posterior shoulder girdle; manual work and modalities as indicated.     PT Home Exercise Plan  VHI  Consulted and Agree with Plan of Care  Patient       Patient will benefit from skilled therapeutic intervention in order to improve the following deficits  and impairments:  Decreased range of motion, Difficulty walking, Increased fascial restricitons, Decreased endurance, Impaired UE functional use, Decreased activity tolerance, Pain, Impaired flexibility, Improper body mechanics, Decreased mobility, Decreased strength, Postural dysfunction  Visit Diagnosis: Pain in left leg  Pain in right leg  Other symptoms and signs involving the musculoskeletal system  Acute pain of right shoulder     Problem List Patient Active Problem List   Diagnosis Date Noted  . Vertigo 05/16/2019  . Lumbar spinal stenosis 04/23/2019  . Impingement syndrome, shoulder, right 02/19/2015    Ronaldo Miyamoto, SPTA 06/05/19 11:37 AM  This entire session was performed under direct supervision and direction of a licensed Physical Brewing technologist. I have personally read, edited and approved of the note as written.  Kerin Perna, PTA 06/05/19 12:20 PM  [place], [place], [address] Phone: [phone]   Fax:  [phone]  Name: Tru Rana MRN: [id number] Date of Birth: 12-09-1934

## 2019-06-07 ENCOUNTER — Ambulatory Visit (INDEPENDENT_AMBULATORY_CARE_PROVIDER_SITE_OTHER): Payer: Medicare Other | Admitting: Physical Therapy

## 2019-06-07 ENCOUNTER — Encounter: Payer: Self-pay | Admitting: Physical Therapy

## 2019-06-07 ENCOUNTER — Other Ambulatory Visit: Payer: Self-pay

## 2019-06-07 DIAGNOSIS — M79605 Pain in left leg: Secondary | ICD-10-CM | POA: Diagnosis not present

## 2019-06-07 DIAGNOSIS — R29898 Other symptoms and signs involving the musculoskeletal system: Secondary | ICD-10-CM | POA: Diagnosis not present

## 2019-06-07 DIAGNOSIS — M79604 Pain in right leg: Secondary | ICD-10-CM | POA: Diagnosis present

## 2019-06-07 DIAGNOSIS — R293 Abnormal posture: Secondary | ICD-10-CM | POA: Diagnosis not present

## 2019-06-07 DIAGNOSIS — M25511 Pain in right shoulder: Secondary | ICD-10-CM | POA: Diagnosis not present

## 2019-06-07 NOTE — Therapy (Addendum)
[place], [place], [address] Phone: [phone]   Fax:  [phone]  Physical Therapy Treatment  Patient Details  Name: Henry Matthews MRN: [id number] Date of Birth: May 14, 1934 Referring Provider (PT): Dr Dianah Field   Encounter Date: 06/07/2019  PT End of Session - 06/07/19 0849    Visit Number  8    Number of Visits  12    Date for PT Re-Evaluation  06/07/19    PT Start Time  9937    PT Stop Time  0927    PT Time Calculation (min)  40 min       Past Medical History:  Diagnosis Date  . Diabetes mellitus without complication ([place])   . Hyperlipidemia   . Hypertension     History reviewed. No pertinent surgical history.  There were no vitals filed for this visit.  Subjective Assessment - 06/07/19 0850    Subjective  He reports having no pain, but some mild stiffness in both leg upon waking. Stiffness improves quickly with movement per pt statement. HEP is going well and he stated that he "wants to work on it on his own after today".    Pertinent History  Rt shoulder pain - has had two injections in the past with some improvement; injection in the Rt hip a few years ago; scrapel in shoulder and hip war ~ 36 yrs ago; macular degeneration; HTN; AODM    Currently in Pain?  No/denies    Pain Score  0-No pain    Pain Location  Leg    Pain Orientation  Left    Pain Onset  More than a month ago    Pain Score  0    Pain Location  Shoulder    Pain Orientation  Right         [facility] PT Assessment - 06/07/19 0001      Assessment   Medical Diagnosis  Bilateral hamstring/leg pain; Rt shoulder pain     Referring Provider (PT)  Dr Dianah Field    Onset Date/Surgical Date  03/19/19   shoulder pain on/off for several years, worse x 1 month     Hand Dominance  Right    Next MD Visit  05/09/19    Prior Therapy  many yrs ago for hip       AROM   Right Shoulder Flexion  141 Degrees    Right Shoulder  Internal Rotation  --   thumb to T10   Right Shoulder External Rotation  80 Degrees      [facility] Adult PT Treatment/Exercise - 06/07/19 0001      Knee/Hip Exercises: Stretches   Active Hamstring Stretch  Right;Left;2 reps;20 seconds   standing, foot on stairs   Passive Hamstring Stretch  Right;Left;30 seconds;2 reps   supine with strap   Passive Hamstring Stretch Limitations  --   added HS stretch-abducted & adducted positions-reps above   ITB Stretch  Right;Left;2 reps;30 seconds    Other Knee/Hip Stretches  down dog at wall x 1, 30 sec      Knee/Hip Exercises: Aerobic   Nustep  L5 x 5 min     Other Aerobic  laps between sets x 4      Knee/Hip Exercises: Standing   Hip Abduction  Stengthening;Right;Left;2 sets;10 reps;Knee straight   2 lb   Hip Extension  Stengthening;Right;Left;2 sets;10 reps;Knee straight   2 lb   Wall Squat  2 sets;10 reps;3 seconds    SLS  toe taps to cones at 3, 12, 9 O'clock, Rt and Lt, 2 setx, x 5 each side   LOB X 3, pt self correct-step strategy     Shoulder Exercises: Standing   Row  Strengthening;10 reps;Both   reverse wall push up, VC's and tactile cues for form      PT Long Term Goals - 06/07/19 1110      PT LONG TERM GOAL #1   Title  Improve standing posture with patient demonstrating more upright posture    Time  6    Period  Weeks    Status  On-going      PT LONG TERM GOAL #2   Title  Increase hamstring flexibility to ~ 70 deg bilat    Baseline  71 Rt, 72 Lt (06/05/19)    Time  6    Period  Weeks    Status  Achieved      PT LONG TERM GOAL #3   Title  Patient to report ability to stand and walk for functional periods of time without pain or limitations    Baseline  per pt statement    Time  6    Period  Weeks    Status  Achieved      PT LONG TERM GOAL #4   Title  Independent in HEP    Time  6    Period  Weeks    Status  On-going      PT LONG TERM GOAL #5   Title  Improve FOTO to </= 33% limitation    Baseline  31% (06/05/19)     Time  6    Period  Weeks    Status  Achieved      PT LONG TERM GOAL #6   Title  Increase AROM Rt shoulder to equal or greater thatn AROM Lt shoulder    Baseline  Lt 150 deg, Rt 145 deg (06/05/19)    Time  6    Period  Weeks    Status  On-going      PT LONG TERM GOAL #7   Title  Decrease pain Rt shoulder by 50-75% allowing patient to use Rt UE for all functional activities    Baseline  75% per pt statement    Time  6    Period  Weeks    Status  Achieved            Plan - 06/07/19 1040    Clinical Impression Statement  Pt has made good progress with all of his LTG's; goals are partially met. He had no increase in Rt shoulder or Lt leg pain with exercise. Pt pleased with current level of function; requests to hold therapy.    Stability/Clinical Decision Making  Stable/Uncomplicated    Rehab Potential  Good    PT Frequency  2x / week    PT Duration  6 weeks    PT Treatment/Interventions  ADLs/Self Care Home Management;Patient/family education;Cryotherapy;Electrical Stimulation;Iontophoresis 65m/ml Dexamethasone;Moist Heat;Ultrasound;Gait training;Stair training;Functional mobility training;Therapeutic activities;Therapeutic exercise;Balance training;Neuromuscular re-education;Manual techniques;Passive range of motion;Dry needling;Taping    PT Next Visit Plan  Hold therapy per pt request, until (07/08/19); will d/c if pt doesn't return.    PT Home Exercise Plan  VHI    Consulted and Agree with Plan of Care  Patient       Patient will benefit from skilled therapeutic intervention in order to improve the following deficits and impairments:  Decreased range of motion, Difficulty walking, Increased fascial restricitons,  Decreased endurance, Impaired UE functional use, Decreased activity tolerance, Pain, Impaired flexibility, Improper body mechanics, Decreased mobility, Decreased strength, Postural dysfunction  Visit Diagnosis: Pain in right leg  Pain in left leg  Other symptoms and  signs involving the musculoskeletal system  Acute pain of right shoulder  Abnormal posture     Problem List Patient Active Problem List   Diagnosis Date Noted  . Vertigo 05/16/2019  . Lumbar spinal stenosis 04/23/2019  . Impingement syndrome, shoulder, right 02/19/2015    Ronaldo Miyamoto, SPTA 06/07/19 11:14 AM  This entire session was performed under direct supervision and direction of a licensed Physical [employer] technologist. I have personally read, edited and approved of the note as written.  Kerin Perna, PTA 06/07/19 1:06 PM   [facility] [place], [place], [address] Phone: [phone]   Fax:  [phone]  Name: Ronda Kazmi MRN: [id number] Date of Birth: 1934/08/26  PHYSICAL THERAPY DISCHARGE SUMMARY  Visits from Start of Care: 7  Current functional level related to goals / functional outcomes: See last progress note for discharge status   Remaining deficits: Unknown    Education / Equipment: HEP  Plan: Patient agrees to discharge.  Patient goals were partially met. Patient is being discharged due to being pleased with the current functional level.  ?????     Celyn P. Helene Kelp PT, MPH 08/02/19 4:12 PM

## 2020-05-07 ENCOUNTER — Ambulatory Visit: Payer: Medicare Other | Admitting: Sports Medicine

## 2021-12-02 IMAGING — DX DG SHOULDER 2+V*R*
3 series · 3 of 3 positions shown · non-contrast
Comparison: None.

CLINICAL DATA: Right shoulder pain for years, no known injury

EXAM:
RIGHT SHOULDER - 2+ VIEW

[shoulder grashey]
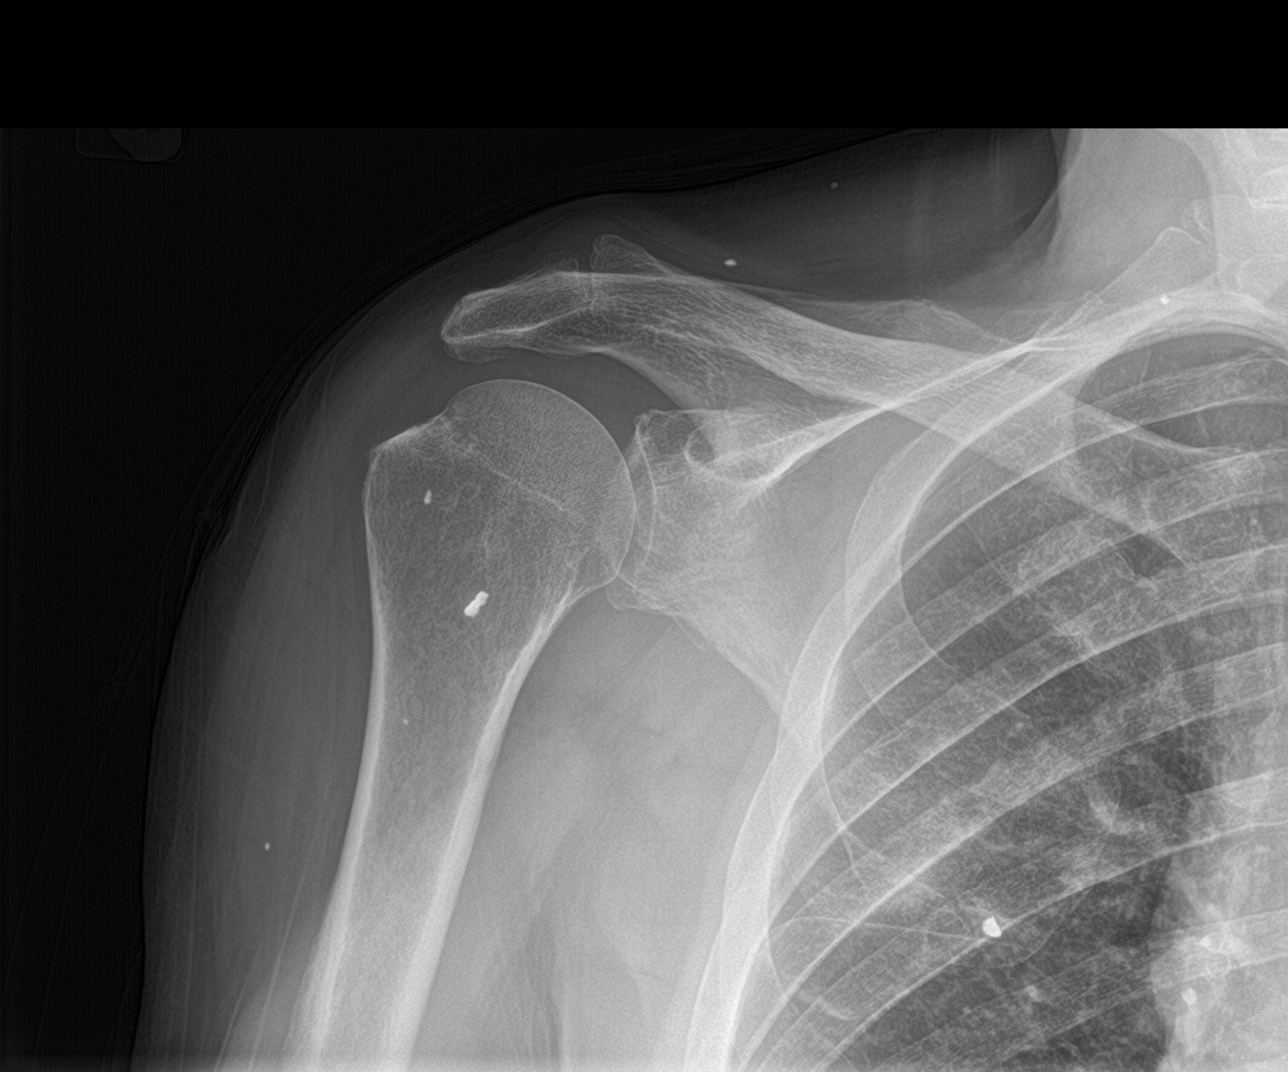

[shoulder y view]
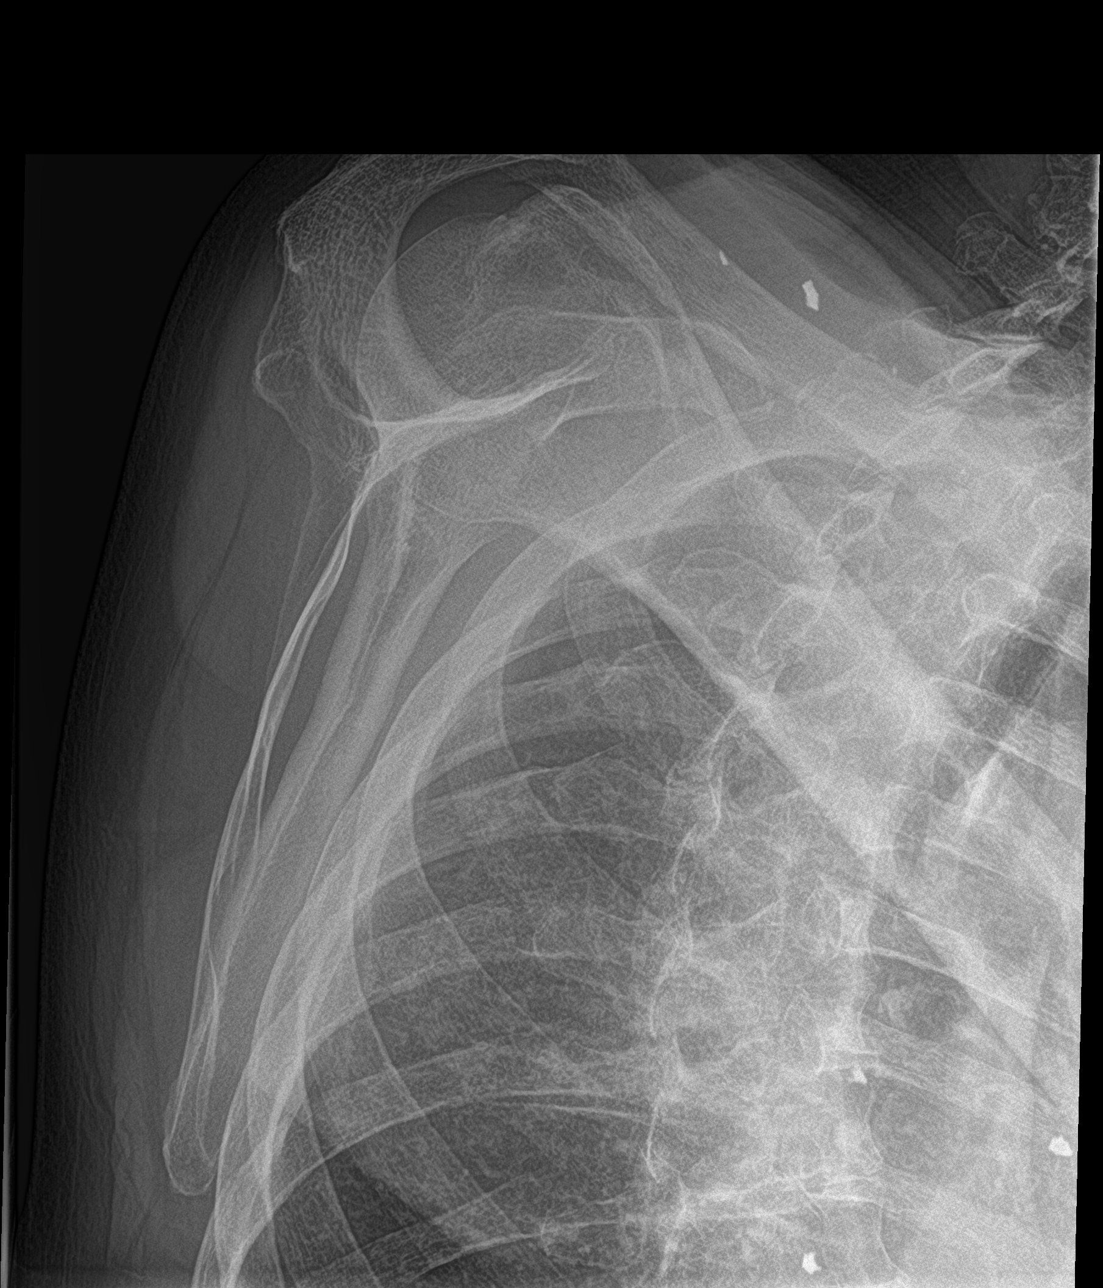

[shoulder axillary]
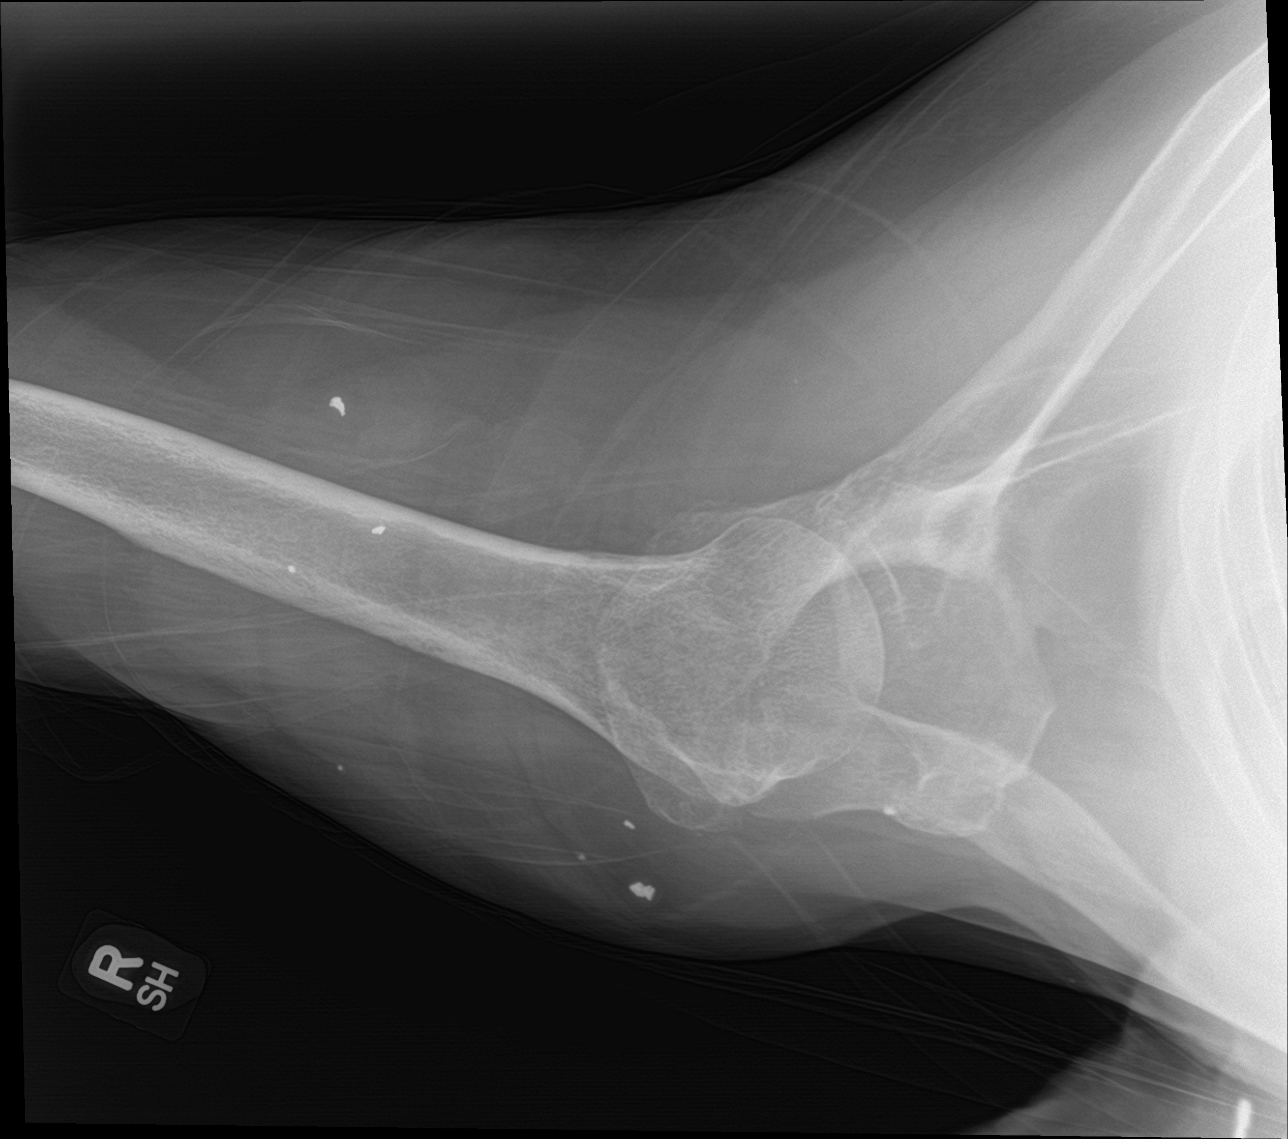

[3 of 3 positions shown; findings below may reference images not displayed]

FINDINGS: There is no evidence of fracture or dislocation. Mild
acromioclavicular and glenohumeral arthrosis. Metallic debris in the
soft tissues about the upper chest and right shoulder.
IMPRESSION: 1. No acute fracture or dislocation. Mild acromioclavicular and
glenohumeral arthrosis.

2.  Metallic debris in the soft tissues.

## 2021-12-30 IMAGING — CT CT L SPINE W/O CM
3 series · 12 of 33 positions shown, 14 images · non-contrast
Comparison: None.

CLINICAL DATA: Lumbar spinal stenosis.  Interventional planning

EXAM:
CT LUMBAR SPINE WITHOUT CONTRAST
TECHNIQUE: Multidetector CT imaging of the lumbar spine was performed without
intravenous contrast administration. Multiplanar CT image
reconstructions were also generated.

[Series 4: l spine soft · axial · 0.31mm/px · z∈[-322,-146]mm · 4 of 126 slices shown, 5 images]
[im 20/126  soft-tissue]
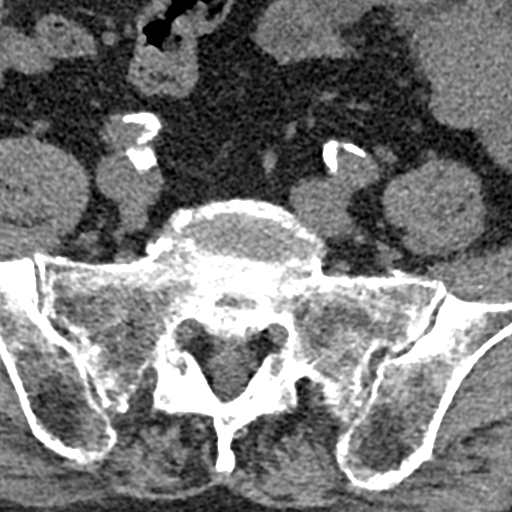
[im 20/126  bone]
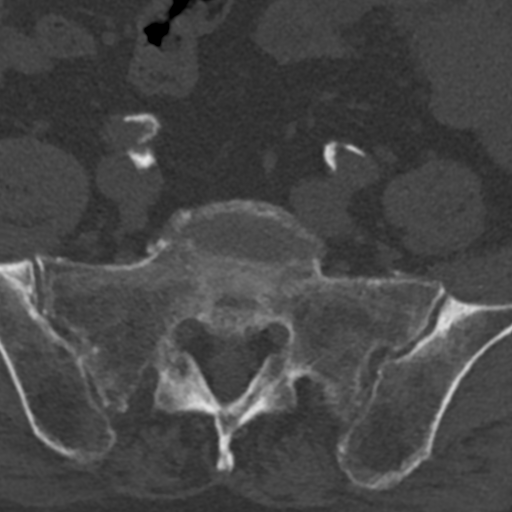
[im 49/126  bone]
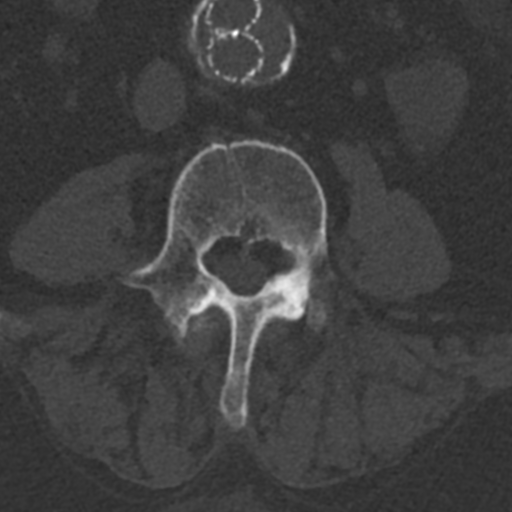
[im 77/126  bone]
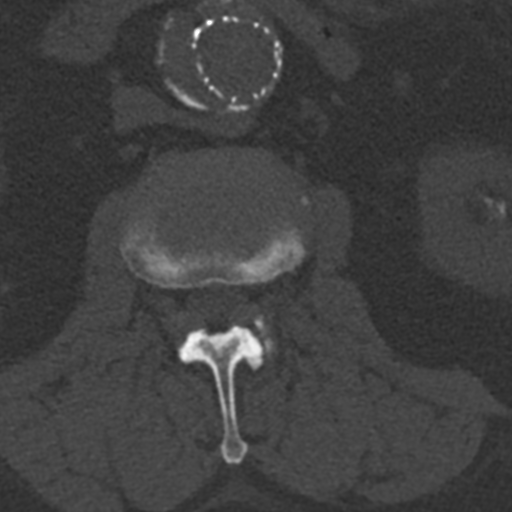
[im 106/126  bone]
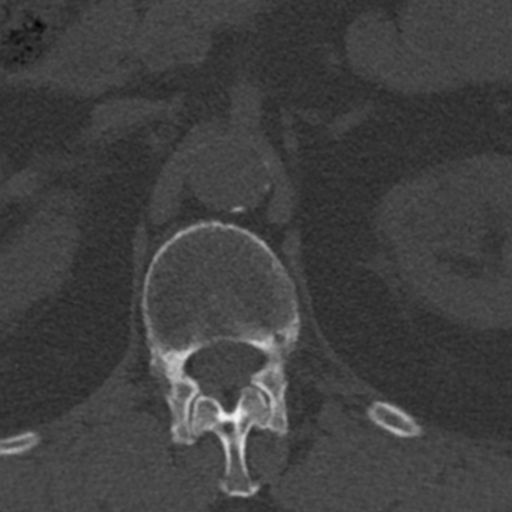

[Series 7: sagittal bone · sagittal · 0.38mm/px · 5 of 73 slices shown, 6 images]
[im 25/73  bone]
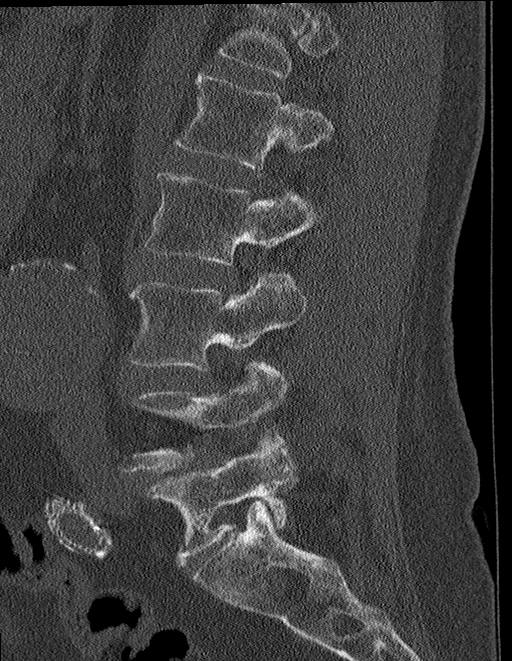
[im 31/73  bone]
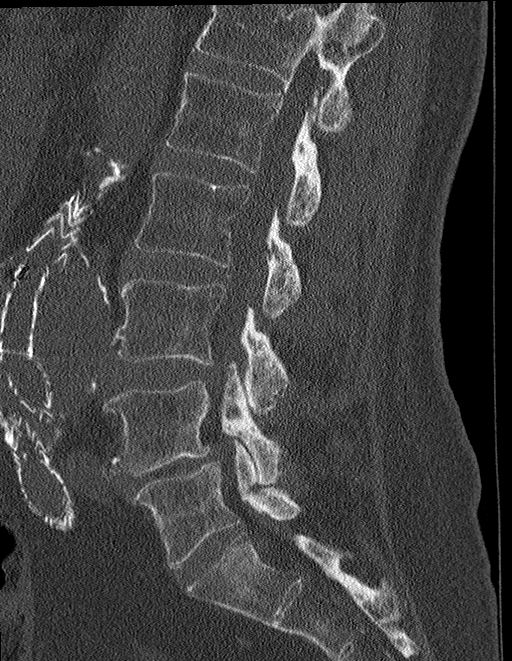
[im 37/73  soft-tissue]
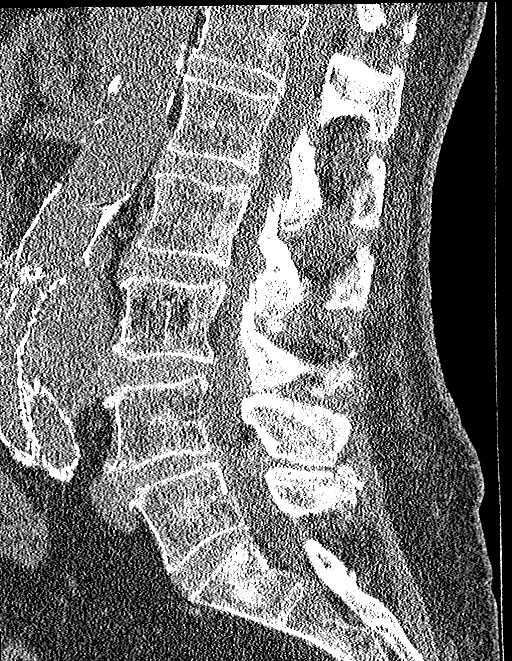
[im 37/73  bone]
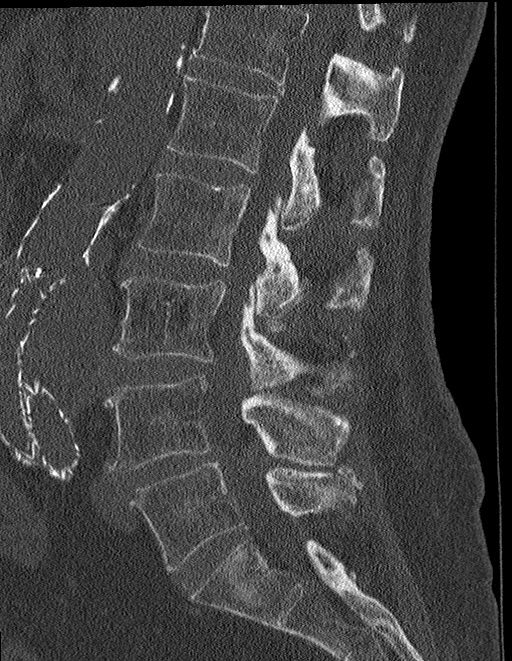
[im 43/73  bone]
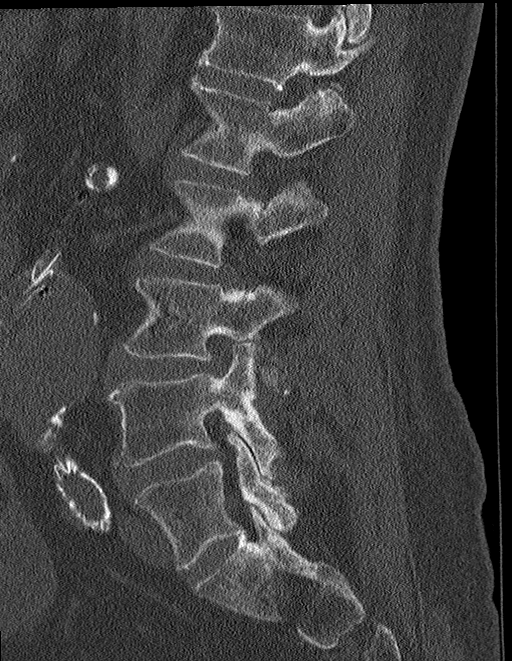
[im 49/73  bone]
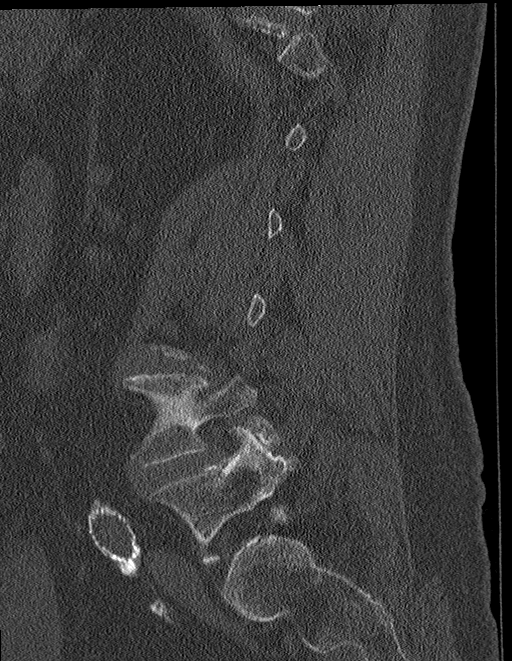

[Series 8: coronal bone · coronal · 0.27mm/px · 3 of 73 slices shown]
[im 15/73  bone]
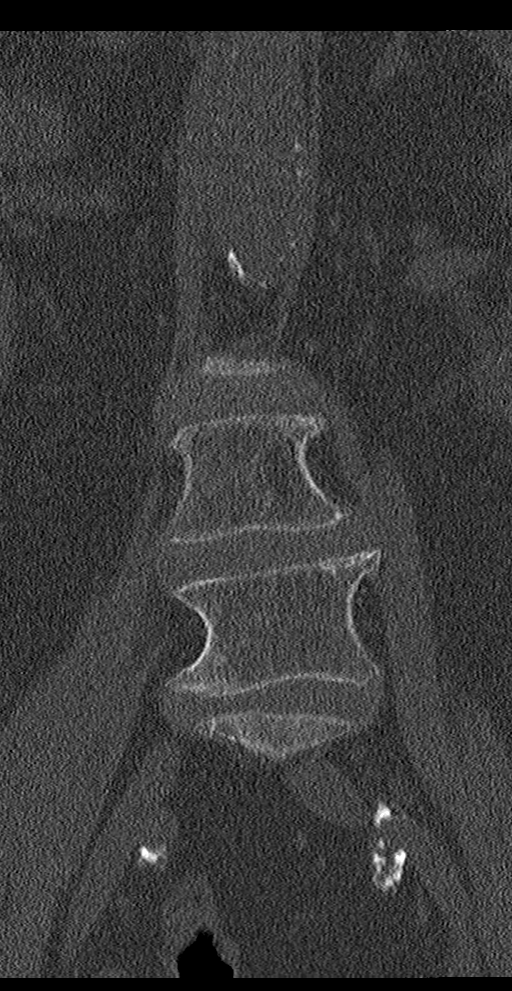
[im 29/73  bone]
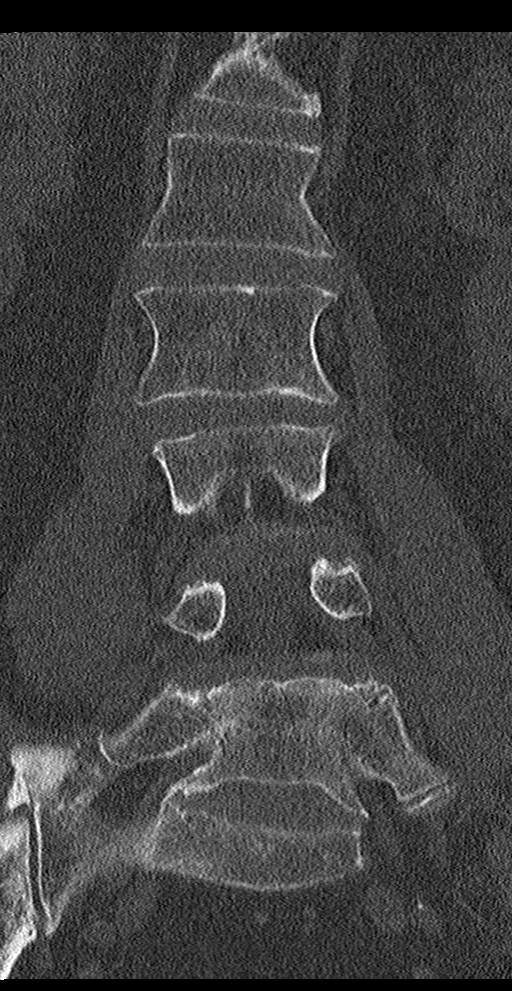
[im 44/73  bone]
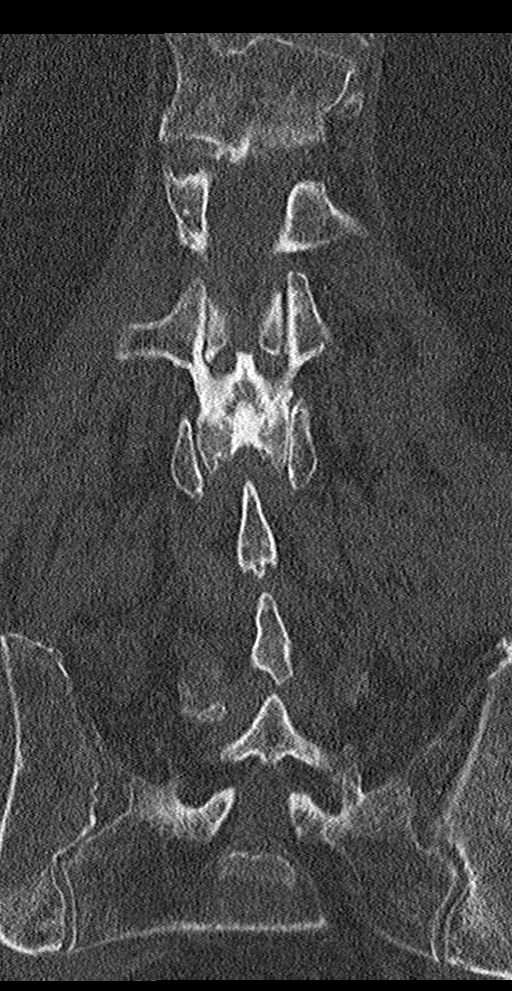

[12 of 33 positions shown; findings below may reference images not displayed]

FINDINGS: Segmentation: 5 lumbar type vertebrae based on the lowest ribs.
There is incomplete segmentation of the L5 transverse processes from
the sacrum

Alignment: Grade 1 anterolisthesis at L4-5. Slight retrolisthesis at
L3-4

Vertebrae: No acute fracture or focal pathologic process. Apparent
sclerosis at the posterior S1 body on sagittal reformats is non
masslike on axial slices

Paraspinal and other soft tissues: Aortic aneurysm with aorto
bi-iliac stent graft. Left lower pole nephrolithiasis.

Disc levels:

T12- L1: Minor spondylosis.

L1-L2: Minor spondylosis.

L2-L3: Disc bulging greatest at the foramina. Posterior element
hypertrophy. There is high-grade spinal stenosis when these
degenerative changes are combined with short pedicles. Patent
appearance of the foramina

L3-L4: Disc narrowing and bulging with posterior element
hypertrophy. High-grade spinal stenosis. Moderate left foraminal
narrowing primarily due to disc bulge. There is a bubble of gas deep
to the right facet, implying a synovial cyst.

L4-L5: Greatest level of degenerative disc narrowing with bulging.
Advanced degenerative posterior element hypertrophy. Severe spinal
stenosis. Moderate right foraminal narrowing primarily from disc
height loss and bulging.

L5-S1:Narrow disc.  Minor facet spurring.  No neural compression.
IMPRESSION: 1. Diffuse lumbar spine degeneration with spinal stenosis
exacerbated by short pedicles.
2. Advanced spinal stenosis from L2-3 to L4-5, particularly severe
at L4-5.
3. Moderate foraminal narrowings on the left at L3-4 and right at
L4-5.
